# Patient Record
Sex: Female | Born: 1985 | Race: White | Hispanic: No | Smoking: Former smoker
Health system: Southern US, Community
[De-identification: ages and names within clinical notes are randomized; demographics above are authoritative.]

## PROBLEM LIST (undated history)

## (undated) ENCOUNTER — Inpatient Hospital Stay (HOSPITAL_COMMUNITY): Payer: Self-pay

## (undated) DIAGNOSIS — F419 Anxiety disorder, unspecified: Secondary | ICD-10-CM

## (undated) DIAGNOSIS — Z8719 Personal history of other diseases of the digestive system: Secondary | ICD-10-CM

## (undated) DIAGNOSIS — F99 Mental disorder, not otherwise specified: Secondary | ICD-10-CM

## (undated) DIAGNOSIS — M1990 Unspecified osteoarthritis, unspecified site: Secondary | ICD-10-CM

## (undated) DIAGNOSIS — G709 Myoneural disorder, unspecified: Secondary | ICD-10-CM

## (undated) DIAGNOSIS — K219 Gastro-esophageal reflux disease without esophagitis: Secondary | ICD-10-CM

## (undated) DIAGNOSIS — F32A Depression, unspecified: Secondary | ICD-10-CM

## (undated) DIAGNOSIS — F319 Bipolar disorder, unspecified: Secondary | ICD-10-CM

## (undated) DIAGNOSIS — F329 Major depressive disorder, single episode, unspecified: Secondary | ICD-10-CM

## (undated) HISTORY — PX: DILATION AND CURETTAGE OF UTERUS: SHX78

## (undated) HISTORY — PX: HAND SURGERY: SHX662

---

## 2015-02-05 NOTE — L&D Delivery Note (Signed)
Patient is a 30 y.o. now G2P0010 who admitted for IOL for gestation HTN, now s/p NSVD at 3081w5d  Delivery Note At 8:30 PM a viable female was delivered via Vaginal, Spontaneous Delivery. Head delivered LOA. No nuchal cord present. Shoulder and body delivered in usual fashion. Infant to mother's abdomen, stimulated and bulb suctioned. Cord clamped x 2 after and cut by FOB, and infant to warmer. Cord blood drawn. Placenta delivered spontaneously with gentle cord traction. Fundus firm with massage and Pitocin. Perineum inspected and found to have bilateral labial abrasions, and a second degree perineal laceration, which was repaired with 3.0 vycril with good hemostasis achieved.  APGAR: 6, ; weight pending   Placenta status: intact, sent to pathology Cord:  3-vessel  Anesthesia:  Epidural Episiotomy:  none Lacerations:  Second-degree perineal Suture Repair: 3.0 vicryl Est. Blood Loss (mL):  100  Mom to postpartum.  Baby to Couplet care / Skin to Skin.  Kandra NicolasJulie P Degele 10/23/2015, 9:06 PM  OB FELLOW DELIVERY ATTESTATION  I was gloved and present for the delivery in its entirety, and I agree with the above resident's note.    Ernestina PennaNicholas Donevin Sainsbury, MD 9:22 PM

## 2015-04-12 LAB — OB RESULTS CONSOLE ABO/RH: RH TYPE: POSITIVE

## 2015-04-12 LAB — OB RESULTS CONSOLE HEPATITIS B SURFACE ANTIGEN: HEP B S AG: NEGATIVE

## 2015-04-12 LAB — OB RESULTS CONSOLE TSH: TSH: 1.34

## 2015-04-12 LAB — OB RESULTS CONSOLE RUBELLA ANTIBODY, IGM: Rubella: IMMUNE

## 2015-04-12 LAB — OB RESULTS CONSOLE HGB/HCT, BLOOD
HCT: 40 %
HEMOGLOBIN: 14 g/dL

## 2015-04-12 LAB — OB RESULTS CONSOLE VARICELLA ZOSTER ANTIBODY, IGG: Varicella: IMMUNE

## 2015-04-12 LAB — OB RESULTS CONSOLE ANTIBODY SCREEN: Antibody Screen: NEGATIVE

## 2015-04-12 LAB — OB RESULTS CONSOLE HIV ANTIBODY (ROUTINE TESTING): HIV: NONREACTIVE

## 2015-04-12 LAB — OB RESULTS CONSOLE PLATELET COUNT: Platelets: 200 10*3/uL

## 2015-04-12 LAB — OB RESULTS CONSOLE GC/CHLAMYDIA
Chlamydia: NEGATIVE
Gonorrhea: NEGATIVE

## 2015-04-12 LAB — OB RESULTS CONSOLE RPR: RPR: NONREACTIVE

## 2015-04-25 ENCOUNTER — Ambulatory Visit (INDEPENDENT_AMBULATORY_CARE_PROVIDER_SITE_OTHER): Payer: Medicaid Other | Admitting: Advanced Practice Midwife

## 2015-04-25 ENCOUNTER — Other Ambulatory Visit (HOSPITAL_COMMUNITY)
Admission: RE | Admit: 2015-04-25 | Discharge: 2015-04-25 | Disposition: A | Discharge status: Home / Self Care | Payer: Medicaid Other | Source: Ambulatory Visit | Attending: Advanced Practice Midwife | Admitting: Advanced Practice Midwife

## 2015-04-25 ENCOUNTER — Encounter: Payer: Self-pay | Admitting: Advanced Practice Midwife

## 2015-04-25 ENCOUNTER — Encounter: Payer: Self-pay | Admitting: Women's Health

## 2015-04-25 VITALS — BP 110/80 | HR 80 | Ht 67.0 in | Wt 158.0 lb

## 2015-04-25 DIAGNOSIS — Z0283 Encounter for blood-alcohol and blood-drug test: Secondary | ICD-10-CM

## 2015-04-25 DIAGNOSIS — Z124 Encounter for screening for malignant neoplasm of cervix: Secondary | ICD-10-CM

## 2015-04-25 DIAGNOSIS — Z1151 Encounter for screening for human papillomavirus (HPV): Secondary | ICD-10-CM | POA: Diagnosis present

## 2015-04-25 DIAGNOSIS — Z3492 Encounter for supervision of normal pregnancy, unspecified, second trimester: Secondary | ICD-10-CM | POA: Diagnosis not present

## 2015-04-25 DIAGNOSIS — Z01419 Encounter for gynecological examination (general) (routine) without abnormal findings: Secondary | ICD-10-CM | POA: Diagnosis present

## 2015-04-25 DIAGNOSIS — Z3490 Encounter for supervision of normal pregnancy, unspecified, unspecified trimester: Secondary | ICD-10-CM | POA: Insufficient documentation

## 2015-04-25 DIAGNOSIS — Z3682 Encounter for antenatal screening for nuchal translucency: Secondary | ICD-10-CM

## 2015-04-25 MED ORDER — PROMETHAZINE HCL 12.5 MG PO TABS: 12.5000 mg | ORAL_TABLET | Freq: Four times a day (QID) | ORAL | Status: DC | PRN

## 2015-04-25 NOTE — Patient Instructions (Signed)
 First Trimester of Pregnancy The first trimester of pregnancy is from week 1 until the end of week 12 (months 1 through 3). A week after a sperm fertilizes an egg, the egg will implant on the wall of the uterus. This embryo will begin to develop into a baby. Genes from you and your partner are forming the baby. The female genes determine whether the baby is a boy or a girl. At 6-8 weeks, the eyes and face are formed, and the heartbeat can be seen on ultrasound. At the end of 12 weeks, all the baby's organs are formed.  Now that you are pregnant, you will want to do everything you can to have a healthy baby. Two of the most important things are to get good prenatal care and to follow your health care provider's instructions. Prenatal care is all the medical care you receive before the baby's birth. This care will help prevent, find, and treat any problems during the pregnancy and childbirth. BODY CHANGES Your body goes through many changes during pregnancy. The changes vary from woman to woman.   You may gain or lose a couple of pounds at first.  You may feel sick to your stomach (nauseous) and throw up (vomit). If the vomiting is uncontrollable, call your health care provider.  You may tire easily.  You may develop headaches that can be relieved by medicines approved by your health care provider.  You may urinate more often. Painful urination may mean you have a bladder infection.  You may develop heartburn as a result of your pregnancy.  You may develop constipation because certain hormones are causing the muscles that push waste through your intestines to slow down.  You may develop hemorrhoids or swollen, bulging veins (varicose veins).  Your breasts may begin to grow larger and become tender. Your nipples may stick out more, and the tissue that surrounds them (areola) may become darker.  Your gums may bleed and may be sensitive to brushing and flossing.  Dark spots or blotches  (chloasma, mask of pregnancy) may develop on your face. This will likely fade after the baby is born.  Your menstrual periods will stop.  You may have a loss of appetite.  You may develop cravings for certain kinds of food.  You may have changes in your emotions from day to day, such as being excited to be pregnant or being concerned that something may go wrong with the pregnancy and baby.  You may have more vivid and strange dreams.  You may have changes in your hair. These can include thickening of your hair, rapid growth, and changes in texture. Some women also have hair loss during or after pregnancy, or hair that feels dry or thin. Your hair will most likely return to normal after your baby is born. WHAT TO EXPECT AT YOUR PRENATAL VISITS During a routine prenatal visit:  You will be weighed to make sure you and the baby are growing normally.  Your blood pressure will be taken.  Your abdomen will be measured to track your baby's growth.  The fetal heartbeat will be listened to starting around week 10 or 12 of your pregnancy.  Test results from any previous visits will be discussed. Your health care provider may ask you:  How you are feeling.  If you are feeling the baby move.  If you have had any abnormal symptoms, such as leaking fluid, bleeding, severe headaches, or abdominal cramping.  If you have any questions. Other   tests that may be performed during your first trimester include:  Blood tests to find your blood type and to check for the presence of any previous infections. They will also be used to check for low iron levels (anemia) and Rh antibodies. Later in the pregnancy, blood tests for diabetes will be done along with other tests if problems develop.  Urine tests to check for infections, diabetes, or protein in the urine.  An ultrasound to confirm the proper growth and development of the baby.  An amniocentesis to check for possible genetic problems.  Fetal  screens for spina bifida and Down syndrome.  You may need other tests to make sure you and the baby are doing well. HOME CARE INSTRUCTIONS  Medicines  Follow your health care provider's instructions regarding medicine use. Specific medicines may be either safe or unsafe to take during pregnancy.  Take your prenatal vitamins as directed.  If you develop constipation, try taking a stool softener if your health care provider approves. Diet  Eat regular, well-balanced meals. Choose a variety of foods, such as meat or vegetable-based protein, fish, milk and low-fat dairy products, vegetables, fruits, and whole grain breads and cereals. Your health care provider will help you determine the amount of weight gain that is right for you.  Avoid raw meat and uncooked cheese. These carry germs that can cause birth defects in the baby.  Eating four or five small meals rather than three large meals a day may help relieve nausea and vomiting. If you start to feel nauseous, eating a few soda crackers can be helpful. Drinking liquids between meals instead of during meals also seems to help nausea and vomiting.  If you develop constipation, eat more high-fiber foods, such as fresh vegetables or fruit and whole grains. Drink enough fluids to keep your urine clear or pale yellow. Activity and Exercise  Exercise only as directed by your health care provider. Exercising will help you:  Control your weight.  Stay in shape.  Be prepared for labor and delivery.  Experiencing pain or cramping in the lower abdomen or low back is a good sign that you should stop exercising. Check with your health care provider before continuing normal exercises.  Try to avoid standing for long periods of time. Move your legs often if you must stand in one place for a long time.  Avoid heavy lifting.  Wear low-heeled shoes, and practice good posture.  You may continue to have sex unless your health care provider directs you  otherwise. Relief of Pain or Discomfort  Wear a good support bra for breast tenderness.   Take warm sitz baths to soothe any pain or discomfort caused by hemorrhoids. Use hemorrhoid cream if your health care provider approves.   Rest with your legs elevated if you have leg cramps or low back pain.  If you develop varicose veins in your legs, wear support hose. Elevate your feet for 15 minutes, 3-4 times a day. Limit salt in your diet. Prenatal Care  Schedule your prenatal visits by the twelfth week of pregnancy. They are usually scheduled monthly at first, then more often in the last 2 months before delivery.  Write down your questions. Take them to your prenatal visits.  Keep all your prenatal visits as directed by your health care provider. Safety  Wear your seat belt at all times when driving.  Make a list of emergency phone numbers, including numbers for family, friends, the hospital, and police and fire departments. General   Tips  Ask your health care provider for a referral to a local prenatal education class. Begin classes no later than at the beginning of month 6 of your pregnancy.  Ask for help if you have counseling or nutritional needs during pregnancy. Your health care provider can offer advice or refer you to specialists for help with various needs.  Do not use hot tubs, steam rooms, or saunas.  Do not douche or use tampons or scented sanitary pads.  Do not cross your legs for long periods of time.  Avoid cat litter boxes and soil used by cats. These carry germs that can cause birth defects in the baby and possibly loss of the fetus by miscarriage or stillbirth.  Avoid all smoking, herbs, alcohol, and medicines not prescribed by your health care provider. Chemicals in these affect the formation and growth of the baby.  Schedule a dentist appointment. At home, brush your teeth with a soft toothbrush and be gentle when you floss. SEEK MEDICAL CARE IF:   You have  dizziness.  You have mild pelvic cramps, pelvic pressure, or nagging pain in the abdominal area.  You have persistent nausea, vomiting, or diarrhea.  You have a bad smelling vaginal discharge.  You have pain with urination.  You notice increased swelling in your face, hands, legs, or ankles. SEEK IMMEDIATE MEDICAL CARE IF:   You have a fever.  You are leaking fluid from your vagina.  You have spotting or bleeding from your vagina.  You have severe abdominal cramping or pain.  You have rapid weight gain or loss.  You vomit blood or material that looks like coffee grounds.  You are exposed to German measles and have never had them.  You are exposed to fifth disease or chickenpox.  You develop a severe headache.  You have shortness of breath.  You have any kind of trauma, such as from a fall or a car accident. Document Released: 01/15/2001 Document Revised: 06/07/2013 Document Reviewed: 12/01/2012 [employer] Patient Information 2015 [employer]. This information is not intended to replace advice given to you by your health care provider. Make sure you discuss any questions you have with your health care provider.   Nausea & Vomiting  Have saltine crackers or pretzels by your bed and eat a few bites before you raise your head out of bed in the morning  Eat small frequent meals throughout the day instead of large meals  Drink plenty of fluids throughout the day to stay hydrated, just don't drink a lot of fluids with your meals.  This can make your stomach fill up faster making you feel sick  Do not brush your teeth right after you eat  Products with real ginger are good for nausea, like ginger ale and ginger hard candy Make sure it says made with real ginger!  Sucking on sour candy like lemon heads is also good for nausea  If your prenatal vitamins make you nauseated, take them at night so you will sleep through the nausea  Sea Bands  If you feel like you need  medicine for the nausea & vomiting please let us know  If you are unable to keep any fluids or food down please let us know   Constipation  Drink plenty of fluid, preferably water, throughout the day  Eat foods high in fiber such as fruits, vegetables, and grains  Exercise, such as walking, is a good way to keep your bowels regular  Drink warm fluids, especially warm   prune juice, or decaf coffee  Eat a 1/2 cup of real oatmeal (not instant), 1/2 cup applesauce, and 1/2-1 cup warm prune juice every day  If needed, you may take Colace (docusate sodium) stool softener once or twice a day to help keep the stool soft. If you are pregnant, wait until you are out of your first trimester (12-14 weeks of pregnancy)  If you still are having problems with constipation, you may take Miralax once daily as needed to help keep your bowels regular.  If you are pregnant, wait until you are out of your first trimester (12-14 weeks of pregnancy)  Safe Medications in Pregnancy   Acne: Benzoyl Peroxide Salicylic Acid  Backache/Headache: Tylenol: 2 regular strength every 4 hours OR              2 Extra strength every 6 hours  Colds/Coughs/Allergies: Benadryl (alcohol free) 25 mg every 6 hours as needed Breath right strips Claritin Cepacol throat lozenges Chloraseptic throat spray Cold-Eeze- up to three times per day Cough drops, alcohol free Flonase (by prescription only) Guaifenesin Mucinex Robitussin DM (plain only, alcohol free) Saline nasal spray/drops Sudafed (pseudoephedrine) & Actifed ** use only after [redacted] weeks gestation and if you do not have high blood pressure Tylenol Vicks Vaporub Zinc lozenges Zyrtec   Constipation: Colace Ducolax suppositories Fleet enema Glycerin suppositories Metamucil Milk of magnesia Miralax Senokot Smooth move tea  Diarrhea: Kaopectate Imodium A-D  *NO pepto Bismol  Hemorrhoids: Anusol Anusol HC Preparation  H Tucks  Indigestion: Tums Maalox Mylanta Zantac  Pepcid  Insomnia: Benadryl (alcohol free) 25mg every 6 hours as needed Tylenol PM Unisom, no Gelcaps  Leg Cramps: Tums MagGel  Nausea/Vomiting:  Bonine Dramamine Emetrol Ginger extract Sea bands Meclizine  Nausea medication to take during pregnancy:  Unisom (doxylamine succinate 25 mg tablets) Take one tablet daily at bedtime. If symptoms are not adequately controlled, the dose can be increased to a maximum recommended dose of two tablets daily (1/2 tablet in the morning, 1/2 tablet mid-afternoon and one at bedtime). Vitamin B6 100mg tablets. Take one tablet twice a day (up to 200 mg per day).  Skin Rashes: Aveeno products Benadryl cream or 25mg every 6 hours as needed Calamine Lotion 1% cortisone cream  Yeast infection: Gyne-lotrimin 7 Monistat 7   **If taking multiple medications, please check labels to avoid duplicating the same active ingredients **take medication as directed on the label ** Do not exceed 4000 mg of tylenol in 24 hours **Do not take medications that contain aspirin or ibuprofen      

## 2015-04-25 NOTE — Addendum Note (Signed)
Addended by: Criss AlvinePULLIAM, CHRYSTAL G on: 04/25/2015 03:15 PM   Modules accepted: Orders

## 2015-04-25 NOTE — Progress Notes (Signed)
  Subjective:    Shelley [name] is a G3P0010 324w6d being seen today for her first obstetrical visit.  She had a visit with Jonita AlbeeEden MD, but transferred here.  Her obstetrical history is significant for early SAB.Marland Kitchen.  Pregnancy history fully reviewed.  Has long hx of anxiety--used to take Buspar--wants to start therapy  Patient reports dark brown discharge--has spotted on and off, no SCH on US and neg cultures.  Has HA's, dizziness, some nausea. Smoking 5 cigs/day. (down from 1/2-1 ppd).   Filed Vitals:   04/25/15 1357 04/25/15 1400  BP: 110/80   Pulse: 80   Height:  5\' 7"  (1.702 m)  Weight: 158 lb (71.668 kg)     HISTORY: OB History  Gravida Para Term Preterm AB SAB TAB Ectopic Multiple Living  3    1 1         # Outcome Date GA Lbr Len/2nd Weight Sex Delivery Anes PTL Lv  3 Current           2 SAB 02/05/11          1 Gravida              History reviewed. No pertinent past medical history. Past Surgical History  Procedure Laterality Date  . Dilation and curettage of uterus    . Hand surgery      Left pinky finger   History reviewed. No pertinent family history.   Exam       Pelvic Exam:    Perineum: Normal Perineum   Vulva: normal   Vagina:  normal mucosa, normal discharge, no palpable nodules   Uterus Normal, Gravid, FH: 12     Cervix: Friable, normal appearing dc without odor   Adnexa: Not palpable   Urinary:  urethral meatus normal    System:     Skin: normal coloration and turgor, no rashes    Neurologic: oriented, normal, normal mood   Extremities: normal strength, tone, and muscle mass   HEENT PERRLA   Mouth/Teeth mucous membranes moist, normal dentition   Neck supple and no masses   Cardiovascular: regular rate and rhythm   Respiratory:  appears well, vitals normal, no respiratory distress, acyanotic   Abdomen: soft, non-tender;  FHR: 160 US          Assessment:    Pregnancy: G3P0010 Patient Active Problem List   Diagnosis Date Noted  .  Supervision of normal pregnancy 04/25/2015        Plan:     Initial labs drawn. Continue prenatal vitamins  Problem list reviewed and updated  Reviewed n/v relief measures and warning s/s to report --rx phenergan per request Reviewed recommended weight gain based on pre-gravid BMI  Encouraged well-balanced diet Genetic Screening discussed Integrated Screen: requested.  Ultrasound discussed; fetal survey: requested.  [facility] referral made   Return in about 1 week (around 05/02/2015) for US:NT+1st IT only/4 weeks for LROB.  CRESENZO-DISHMAN,Tudor Chandley 04/25/2015

## 2015-04-26 ENCOUNTER — Telehealth: Payer: Self-pay

## 2015-04-26 LAB — PMP SCREEN PROFILE (10S), URINE
Amphetamine Screen, Ur: NEGATIVE ng/mL
Barbiturate Screen, Ur: NEGATIVE ng/mL
Benzodiazepine Screen, Urine: NEGATIVE ng/mL
CREATININE(CRT), U: 157.1 mg/dL (ref 20.0–300.0)
Cannabinoids Ur Ql Scn: POSITIVE ng/mL
Cocaine(Metab.)Screen, Urine: NEGATIVE ng/mL
METHADONE SCREEN, URINE: NEGATIVE ng/mL
OPIATE SCRN UR: NEGATIVE ng/mL
OXYCODONE+OXYMORPHONE UR QL SCN: NEGATIVE ng/mL
PCP Scrn, Ur: NEGATIVE ng/mL
PH UR, DRUG SCRN: 6.6 (ref 4.5–8.9)
PROPOXYPHENE SCREEN: NEGATIVE ng/mL

## 2015-04-26 NOTE — Telephone Encounter (Signed)
Shelley [name] call about Shelley Cancerlizabeth Ton  1985/06/16. She apply for pregnant medicaid and they don't cover mental health.

## 2015-04-27 LAB — CYTOLOGY - PAP

## 2015-05-02 ENCOUNTER — Ambulatory Visit (INDEPENDENT_AMBULATORY_CARE_PROVIDER_SITE_OTHER): Payer: Medicaid Other

## 2015-05-02 ENCOUNTER — Other Ambulatory Visit: Payer: Self-pay

## 2015-05-02 DIAGNOSIS — Z3A13 13 weeks gestation of pregnancy: Secondary | ICD-10-CM | POA: Diagnosis not present

## 2015-05-02 DIAGNOSIS — Z36 Encounter for antenatal screening of mother: Secondary | ICD-10-CM | POA: Diagnosis not present

## 2015-05-02 DIAGNOSIS — Z3491 Encounter for supervision of normal pregnancy, unspecified, first trimester: Secondary | ICD-10-CM

## 2015-05-02 DIAGNOSIS — Z3682 Encounter for antenatal screening for nuchal translucency: Secondary | ICD-10-CM

## 2015-05-02 NOTE — Progress Notes (Signed)
US 12+6 wks,measurement c/w dates,normal ov's bilat,post pl gr 0,NB present,NT 1.368mm,crl 66.512mm,fhr 164 bpm

## 2015-05-04 LAB — MATERNAL SCREEN, INTEGRATED #1
Crown Rump Length: 66.2 mm
Gest. Age on Collection Date: 12.9 weeks
MATERNAL AGE AT EDD: 31 a
NUCHAL TRANSLUCENCY (NT): 1.8 mm
NUMBER OF FETUSES: 1
PAPP-A VALUE: 593.4 ng/mL
Weight: 156 [lb_av]

## 2015-05-05 ENCOUNTER — Telehealth: Payer: Self-pay | Admitting: Obstetrics & Gynecology

## 2015-05-05 DIAGNOSIS — Z3491 Encounter for supervision of normal pregnancy, unspecified, first trimester: Secondary | ICD-10-CM

## 2015-05-05 NOTE — Telephone Encounter (Signed)
Pt states that she has mentioned to the provider at her last visit and was told that the nausea and dizziness would ease off. Pt states that she has noticed some decrease but she is still having issues with the dizziness and now has had some spells of blacking out. Pt states that this is affecting her job. Pt states that she eats small meals throughout the date, and moving slowly when standing from sitting position and when turning her head. Pt is very concerned and wants to know what she can do.

## 2015-05-05 NOTE — Telephone Encounter (Signed)
Unable to reach the pt. LMOM for her to return the call.

## 2015-05-05 NOTE — Telephone Encounter (Signed)
Tell her stay hydrated move slowly and make appointment to see me on Monday

## 2015-05-09 ENCOUNTER — Encounter: Payer: Self-pay | Admitting: Obstetrics & Gynecology

## 2015-05-09 ENCOUNTER — Ambulatory Visit (INDEPENDENT_AMBULATORY_CARE_PROVIDER_SITE_OTHER): Payer: Self-pay | Admitting: Obstetrics & Gynecology

## 2015-05-09 VITALS — BP 100/70 | HR 96 | Wt 160.0 lb

## 2015-05-09 DIAGNOSIS — Z331 Pregnant state, incidental: Secondary | ICD-10-CM

## 2015-05-09 DIAGNOSIS — Z3492 Encounter for supervision of normal pregnancy, unspecified, second trimester: Secondary | ICD-10-CM

## 2015-05-09 DIAGNOSIS — Z1389 Encounter for screening for other disorder: Secondary | ICD-10-CM

## 2015-05-09 LAB — POCT URINALYSIS DIPSTICK
Blood, UA: NEGATIVE
GLUCOSE UA: NEGATIVE
Ketones, UA: NEGATIVE
LEUKOCYTES UA: NEGATIVE
NITRITE UA: NEGATIVE

## 2015-05-09 NOTE — Telephone Encounter (Signed)
Pt returned call and was given an appointment for this afternoon with Dr. Despina HiddenEure.

## 2015-05-09 NOTE — Progress Notes (Signed)
G2P0010 4490w6d Estimated Date of Delivery: 11/08/15  Blood pressure 100/70, pulse 96, weight 160 lb (72.576 kg).   BP weight and urine results all reviewed and noted.  Please refer to the obstetrical flow sheet for the fundal height and fetal heart rate documentation:  Patient reports good fetal movement, denies any bleeding and no rupture of membranes symptoms or regular contractions. Patient is without complaints. All questions were answered.  Orders Placed This Encounter  Procedures  . POCT urinalysis dipstick    Plan:  Continued routine obstetrical care, has history of vertigo and has dizziness issues, recommend motion sickness bracelets  No Follow-up on file.

## 2015-05-23 ENCOUNTER — Encounter: Payer: Self-pay | Admitting: Women's Health

## 2015-05-31 ENCOUNTER — Encounter: Payer: Self-pay | Admitting: Women's Health

## 2015-05-31 ENCOUNTER — Ambulatory Visit (INDEPENDENT_AMBULATORY_CARE_PROVIDER_SITE_OTHER): Payer: Medicaid Other | Admitting: Women's Health

## 2015-05-31 VITALS — BP 104/72 | HR 76 | Wt 164.0 lb

## 2015-05-31 DIAGNOSIS — Z1389 Encounter for screening for other disorder: Secondary | ICD-10-CM

## 2015-05-31 DIAGNOSIS — Z331 Pregnant state, incidental: Secondary | ICD-10-CM

## 2015-05-31 DIAGNOSIS — Z3492 Encounter for supervision of normal pregnancy, unspecified, second trimester: Secondary | ICD-10-CM

## 2015-05-31 DIAGNOSIS — Z3682 Encounter for antenatal screening for nuchal translucency: Secondary | ICD-10-CM

## 2015-05-31 DIAGNOSIS — Z363 Encounter for antenatal screening for malformations: Secondary | ICD-10-CM

## 2015-05-31 DIAGNOSIS — Z3A17 17 weeks gestation of pregnancy: Secondary | ICD-10-CM

## 2015-05-31 DIAGNOSIS — Z3482 Encounter for supervision of other normal pregnancy, second trimester: Secondary | ICD-10-CM

## 2015-05-31 LAB — POCT URINALYSIS DIPSTICK
Glucose, UA: NEGATIVE
Ketones, UA: NEGATIVE
LEUKOCYTES UA: NEGATIVE
NITRITE UA: NEGATIVE
Protein, UA: NEGATIVE
RBC UA: NEGATIVE

## 2015-05-31 NOTE — Patient Instructions (Signed)

## 2015-05-31 NOTE — Progress Notes (Signed)
Low-risk OB appointment G2P0010 6711w0d Estimated Date of Delivery: 11/08/15 BP 104/72 mmHg  Pulse 76  Wt 164 lb (74.39 kg)  BP, weight, and urine reviewed.  Refer to obstetrical flow sheet for FH & FHR.  No fm yet. Denies cramping, lof, vb, or uti s/s. No complaints. Reviewed warning s/s to report. Plan:  Continue routine obstetrical care  F/U in 2wks for OB appointment and anatomy u/s 2nd IT today

## 2015-06-02 LAB — MATERNAL SCREEN, INTEGRATED #2
ADSF: 0.71
AFP MoM: 1.6
Alpha-Fetoprotein: 55.3 ng/mL
CROWN RUMP LENGTH: 66.2 mm
DIA MoM: 1.85
DIA VALUE: 311.7 pg/mL
Estriol, Unconjugated: 0.71 ng/mL
Gest. Age on Collection Date: 12.9 weeks
Gestational Age: 17 weeks
Maternal Age at EDD: 31 years
NUCHAL TRANSLUCENCY (NT): 1.8 mm
NUCHAL TRANSLUCENCY MOM: 1.12
NUMBER OF FETUSES: 1
PAPP-A MoM: 0.57
PAPP-A VALUE: 593.4 ng/mL
Test Results:: NEGATIVE
WEIGHT: 156 [lb_av]
Weight: 156 [lb_av]
hCG MoM: 1.02
hCG Value: 29.2 IU/mL

## 2015-06-04 ENCOUNTER — Encounter (HOSPITAL_COMMUNITY): Payer: Self-pay | Admitting: Emergency Medicine

## 2015-06-04 ENCOUNTER — Emergency Department (HOSPITAL_COMMUNITY)
Admission: EM | Admit: 2015-06-04 | Discharge: 2015-06-04 | Discharge status: Against Medical Advice | Payer: Medicaid Other | Attending: Emergency Medicine | Admitting: Emergency Medicine

## 2015-06-04 ENCOUNTER — Emergency Department (HOSPITAL_COMMUNITY): Payer: Medicaid Other

## 2015-06-04 DIAGNOSIS — J069 Acute upper respiratory infection, unspecified: Secondary | ICD-10-CM | POA: Diagnosis not present

## 2015-06-04 DIAGNOSIS — F17210 Nicotine dependence, cigarettes, uncomplicated: Secondary | ICD-10-CM | POA: Insufficient documentation

## 2015-06-04 DIAGNOSIS — R112 Nausea with vomiting, unspecified: Secondary | ICD-10-CM | POA: Diagnosis present

## 2015-06-04 LAB — CBG MONITORING, ED: Glucose-Capillary: 74 mg/dL (ref 65–99)

## 2015-06-04 MED ORDER — PROMETHAZINE HCL 25 MG RE SUPP: 25.0000 mg | Freq: Four times a day (QID) | RECTAL | Status: DC | PRN

## 2015-06-04 MED ORDER — SODIUM CHLORIDE 0.9 % IV BOLUS (SEPSIS)
1000.0000 mL | Freq: Once | INTRAVENOUS | Status: AC
  Administered 2015-06-04: 1000 mL via INTRAVENOUS

## 2015-06-04 MED ORDER — SODIUM CHLORIDE 0.9 % IV BOLUS (SEPSIS): 1000.0000 mL | Freq: Once | INTRAVENOUS | Status: DC

## 2015-06-04 NOTE — ED Notes (Signed)
Patient has multiple complaints. Per patient [facility] patient is 17.[redacted] weeks pregnant. Patient c/o constant vomiting, shortness or breath, coughing, headache, chest pain, and dizziness.

## 2015-06-04 NOTE — ED Provider Notes (Signed)
CSN: [id number]     Arrival date & time 06/04/15  1644 History   First MD Initiated Contact with Patient 06/04/15 1713     Chief Complaint  Patient presents with  . Emesis     (Consider location/radiation/quality/duration/timing/severity/associated sxs/prior Treatment) Patient is a 30 y.o. female presenting with vomiting. The history is provided by the patient (Patient complains of cough congestion for a few days and vomiting she is).  Emesis Severity:  Moderate Timing:  Intermittent Quality:  Undigested food Able to tolerate:  Liquids Onset of vomiting after eating: Unknown. Progression:  Unchanged Chronicity:  New Recent urination:  Normal Context: not post-tussive   Associated symptoms: no abdominal pain, no diarrhea and no headaches     History reviewed. No pertinent past medical history. Past Surgical History  Procedure Laterality Date  . Dilation and curettage of uterus    . Hand surgery      Left pinky finger   No family history on file. Social History  Substance Use Topics  . Smoking status: Current Every Day Smoker -- 0.50 packs/day    Types: Cigarettes  . Smokeless tobacco: Never Used  . Alcohol Use: No   OB History    Gravida Para Term Preterm AB TAB SAB Ectopic Multiple Living   2    1  1         Review of Systems  Constitutional: Negative for appetite change and fatigue.  HENT: Negative for congestion, ear discharge and sinus pressure.   Eyes: Negative for discharge.  Respiratory: Positive for cough.   Cardiovascular: Negative for chest pain.  Gastrointestinal: Positive for vomiting. Negative for abdominal pain and diarrhea.  Genitourinary: Negative for frequency and hematuria.  Musculoskeletal: Negative for back pain.  Skin: Negative for rash.  Neurological: Negative for seizures and headaches.  Psychiatric/Behavioral: Negative for hallucinations.      Allergies  Shellfish allergy and Other  Home Medications   Prior to Admission  medications   Not on File   BP 99/58 mmHg  Pulse 98  Temp(Src) 98.3 F (36.8 C) (Oral)  Resp 17  Ht 5\' 7"  (1.702 m)  Wt 165 lb (74.844 kg)  BMI 25.84 kg/m2  SpO2 100% Physical Exam  Constitutional: She is oriented to person, place, and time. She appears well-developed.  HENT:  Head: Normocephalic.  Mucous membranes mildly dry  Eyes: Conjunctivae and EOM are normal. No scleral icterus.  Neck: Neck supple. No thyromegaly present.  Cardiovascular: Normal rate and regular rhythm.  Exam reveals no gallop and no friction rub.   No murmur heard. Pulmonary/Chest: No stridor. She has no wheezes. She has no rales. She exhibits no tenderness.  Abdominal: She exhibits no distension. There is no tenderness. There is no rebound.  Musculoskeletal: Normal range of motion. She exhibits no edema.  Lymphadenopathy:    She has no cervical adenopathy.  Neurological: She is oriented to person, place, and time. She exhibits normal muscle tone. Coordination normal.  Skin: No rash noted. No erythema.  Psychiatric: She has a normal mood and affect. Her behavior is normal.    ED Course  Procedures (including critical care time) Labs Review Labs Reviewed  URINALYSIS, ROUTINE W REFLEX MICROSCOPIC (NOT AT [facility])  CBG MONITORING, ED    Imaging Review No results found. I have personally reviewed and evaluated these images and lab results as part of my medical decision-making.   EKG Interpretation None      MDM   Final diagnoses:  URI (upper respiratory infection)  Patient was given 1 L of fluids. She stated that she was still feeling nauseated. I ordered a second liter of fluids and Phenergan for nausea. The nurse informed me that the patient had an touch with her OB/GYN doctor and the patient decided to leave [facility]    Bethann Berkshire, MD 06/04/15 1932

## 2015-06-04 NOTE — ED Notes (Signed)
Into assess patient. Pt states that after speaking with her OB who has given her instructions on OTC medications that she can take she has decided to go home AMA at this time. IV removed and patient left with visitor at this time. Zammitt informed

## 2015-06-14 ENCOUNTER — Ambulatory Visit (INDEPENDENT_AMBULATORY_CARE_PROVIDER_SITE_OTHER): Payer: Medicaid Other | Admitting: Advanced Practice Midwife

## 2015-06-14 ENCOUNTER — Ambulatory Visit (INDEPENDENT_AMBULATORY_CARE_PROVIDER_SITE_OTHER): Payer: Medicaid Other

## 2015-06-14 VITALS — BP 106/84 | HR 110 | Wt 160.0 lb

## 2015-06-14 DIAGNOSIS — F17200 Nicotine dependence, unspecified, uncomplicated: Secondary | ICD-10-CM | POA: Diagnosis not present

## 2015-06-14 DIAGNOSIS — Z3492 Encounter for supervision of normal pregnancy, unspecified, second trimester: Secondary | ICD-10-CM

## 2015-06-14 DIAGNOSIS — Z36 Encounter for antenatal screening of mother: Secondary | ICD-10-CM

## 2015-06-14 DIAGNOSIS — O99332 Smoking (tobacco) complicating pregnancy, second trimester: Secondary | ICD-10-CM

## 2015-06-14 DIAGNOSIS — Z3A19 19 weeks gestation of pregnancy: Secondary | ICD-10-CM

## 2015-06-14 DIAGNOSIS — Z331 Pregnant state, incidental: Secondary | ICD-10-CM

## 2015-06-14 DIAGNOSIS — Z1389 Encounter for screening for other disorder: Secondary | ICD-10-CM

## 2015-06-14 DIAGNOSIS — Z363 Encounter for antenatal screening for malformations: Secondary | ICD-10-CM

## 2015-06-14 LAB — POCT URINALYSIS DIPSTICK
Blood, UA: NEGATIVE
GLUCOSE UA: NEGATIVE
KETONES UA: NEGATIVE
Leukocytes, UA: NEGATIVE
Nitrite, UA: NEGATIVE
Protein, UA: NEGATIVE

## 2015-06-14 MED ORDER — NICOTINE 14 MG/24HR TD PT24: 14.0000 mg | MEDICATED_PATCH | Freq: Every day | TRANSDERMAL | Status: DC

## 2015-06-14 MED ORDER — PROVIDA DHA 16-16-1.25-110 MG PO CAPS: 1.0000 | ORAL_CAPSULE | Freq: Every day | ORAL | Status: DC

## 2015-06-14 NOTE — Progress Notes (Signed)
G2P0010 4259w0d Estimated Date of Delivery: 11/08/15  Blood pressure 106/84, pulse 110, weight 160 lb (72.576 kg).   BP weight and urine results all reviewed and noted.  Please refer to the obstetrical flow sheet for the fundal height and fetal heart rate documentation:  Patient reports good fetal movement, denies any bleeding and no rupture of membranes symptoms or regular contractions. US 19 wks,breech,post pl gr 0,normal ov's bilat,cx 3.6cm,svp of fluid 3.9cm,fhr 159 bpm,efw 289 g,measurements c/w dates,anatomy complete,no obvious abnormalities seen  Patient is without complaints.  Smokes 6/cigs a day.  Really wants to quit. Discussed strategies, including patches (rx sent), vaping, gum  Requested Quitline referral. , faxed Quitline info.  All questions were answered.  Orders Placed This Encounter  Procedures  . POCT urinalysis dipstick    Plan:  Continued routine obstetrical care,   Return in about 4 weeks (around 07/12/2015) for LROB.

## 2015-06-14 NOTE — Progress Notes (Signed)
US 19 wks,breech,post pl gr 0,normal ov's bilat,cx 3.6cm,svp of fluid 3.9cm,fhr 159 bpm,efw 289 g,measurements c/w dates,anatomy complete,no obvious abnormalities seen

## 2015-06-18 ENCOUNTER — Inpatient Hospital Stay (HOSPITAL_COMMUNITY)
Admission: AD | Admit: 2015-06-18 | Discharge: 2015-06-18 | Disposition: A | Discharge status: Home / Self Care | Payer: Medicaid Other | Source: Ambulatory Visit | Attending: Obstetrics & Gynecology | Admitting: Obstetrics & Gynecology

## 2015-06-18 ENCOUNTER — Encounter (HOSPITAL_COMMUNITY): Payer: Self-pay | Admitting: *Deleted

## 2015-06-18 DIAGNOSIS — Z3A19 19 weeks gestation of pregnancy: Secondary | ICD-10-CM | POA: Insufficient documentation

## 2015-06-18 DIAGNOSIS — N949 Unspecified condition associated with female genital organs and menstrual cycle: Secondary | ICD-10-CM

## 2015-06-18 DIAGNOSIS — R102 Pelvic and perineal pain: Secondary | ICD-10-CM | POA: Insufficient documentation

## 2015-06-18 DIAGNOSIS — O26892 Other specified pregnancy related conditions, second trimester: Secondary | ICD-10-CM | POA: Insufficient documentation

## 2015-06-18 DIAGNOSIS — F17210 Nicotine dependence, cigarettes, uncomplicated: Secondary | ICD-10-CM | POA: Diagnosis not present

## 2015-06-18 DIAGNOSIS — Z3A20 20 weeks gestation of pregnancy: Secondary | ICD-10-CM

## 2015-06-18 DIAGNOSIS — O9989 Other specified diseases and conditions complicating pregnancy, childbirth and the puerperium: Secondary | ICD-10-CM | POA: Diagnosis not present

## 2015-06-18 LAB — URINALYSIS, ROUTINE W REFLEX MICROSCOPIC
Bilirubin Urine: NEGATIVE
GLUCOSE, UA: NEGATIVE mg/dL
Ketones, ur: 15 mg/dL — AB
LEUKOCYTES UA: NEGATIVE
Nitrite: NEGATIVE
PH: 6 (ref 5.0–8.0)
PROTEIN: NEGATIVE mg/dL
Specific Gravity, Urine: 1.025 (ref 1.005–1.030)

## 2015-06-18 LAB — URINE MICROSCOPIC-ADD ON

## 2015-06-18 LAB — WET PREP, GENITAL
CLUE CELLS WET PREP: NONE SEEN
Sperm: NONE SEEN
Trich, Wet Prep: NONE SEEN
Yeast Wet Prep HPF POC: NONE SEEN

## 2015-06-18 MED ORDER — IBUPROFEN 800 MG PO TABS
800.0000 mg | ORAL_TABLET | Freq: Once | ORAL | Status: AC
  Administered 2015-06-18: 800 mg via ORAL
  Filled 2015-06-18: qty 1

## 2015-06-18 NOTE — MAU Provider Note (Signed)
History     CSN: [id number]  Arrival date and time: 06/18/15 2002   First Provider Initiated Contact with Patient 06/18/15 2041      Chief Complaint  Patient presents with  . Pelvic Pain  . Dizziness   Pelvic Pain The patient's primary symptoms include pelvic pain and vaginal bleeding. This is a new problem. The current episode started today. The problem occurs intermittently. The problem has been unchanged. Pain severity now: 10/10  The problem affects both sides. She is pregnant. Associated symptoms include abdominal pain and nausea. Pertinent negatives include no chills, constipation, diarrhea, dysuria, fever, frequency, urgency or vomiting. The vaginal bleeding is spotting (spotting with wiping ). She has not been passing clots. She has not been passing tissue. Nothing aggravates the symptoms. She has tried acetaminophen for the symptoms. The treatment provided no relief. She is not sexually active. It is unknown whether or not her partner has an STD.  Dizziness This is a new problem. The current episode started today. The problem occurs constantly (patient states that she often feels dizzy when she is in a lot of pain ). The problem has been unchanged. Associated symptoms include abdominal pain and nausea. Pertinent negatives include no chills, fever or vomiting. Nothing aggravates the symptoms. She has tried nothing for the symptoms.     History reviewed. No pertinent past medical history.  Past Surgical History  Procedure Laterality Date  . Dilation and curettage of uterus    . Hand surgery      Left pinky finger    History reviewed. No pertinent family history.  Social History  Substance Use Topics  . Smoking status: Current Every Day Smoker -- 0.50 packs/day    Types: Cigarettes  . Smokeless tobacco: Never Used  . Alcohol Use: No    Allergies:  Allergies  Allergen Reactions  . Shellfish Allergy Anaphylaxis and Hives  . Other Nausea And Vomiting    Red meat  causes abdominal pain    Prescriptions prior to admission  Medication Sig Dispense Refill Last Dose  . nicotine (NICODERM CQ - DOSED IN MG/24 HOURS) 14 mg/24hr patch Place 1 patch (14 mg total) onto the skin daily. 28 patch 6   . Prenat-FeFum-FePo-FA-DHA w/o A (PROVIDA DHA) 16-16-1.25-110 MG CAPS Take 1 tablet by mouth daily. 30 capsule 11     Review of Systems  Constitutional: Negative for fever and chills.  Gastrointestinal: Positive for nausea and abdominal pain. Negative for vomiting, diarrhea and constipation.  Genitourinary: Positive for pelvic pain. Negative for dysuria, urgency and frequency.  Neurological: Positive for dizziness.   Physical Exam   Blood pressure 130/67, pulse 71, temperature 97.6 F (36.4 C), temperature source Oral, resp. rate 32, height 5\' 7"  (1.702 m), weight 72.576 kg (160 lb), SpO2 98 %.  Physical Exam  Nursing note and vitals reviewed. Constitutional: She is oriented to person, place, and time. She appears well-developed and well-nourished. No distress.  HENT:  Head: Normocephalic.  Cardiovascular: Normal rate.   Respiratory: Effort normal.  GI: Soft. There is no tenderness. There is no rebound.  Genitourinary:   External: no lesion Vagina: small amount of white discharge. No blood seen  Cervix: pink, smooth, no CMT, closed/thick  Uterus: AGA, FHT 150 with doppler    Neurological: She is alert and oriented to person, place, and time.  Skin: Skin is warm and dry.  Psychiatric: She has a normal mood and affect.    MAU Course  Procedures  MDM 2202: Patient reports  improvement in her pain with ibuprofen.   Assessment and Plan   1. Round ligament pain    DC home Urine culture pending  Comfort measures reviewed  2nd Trimester precautions  PTL precautions  Fetal kick counts RX: none  Return to MAU as needed FU with OB as planned  Follow-up Information    Follow up with [facility]   Specialty:  Obstetrics and Gynecology    Why:  As scheduled   Contact information:   [address] [place] [address] [phone]        Tawnya Crook 06/18/2015, 8:44 PM

## 2015-06-18 NOTE — Discharge Instructions (Signed)

## 2015-06-18 NOTE — MAU Note (Signed)
Patient presents at 919 weeks gestation with c/o abdominal pain since 0100 today followed by dizziness a couple of hours later. Fetus active. Denies discharge but noted a pinkish tint when voiding that has now graduated to bright red when wiping after voiding.

## 2015-06-19 LAB — GC/CHLAMYDIA PROBE AMP (~~LOC~~) NOT AT ARMC
Chlamydia: NEGATIVE
Neisseria Gonorrhea: NEGATIVE

## 2015-06-20 LAB — URINE CULTURE: Culture: >=100000 — AB

## 2015-06-21 ENCOUNTER — Telehealth: Payer: Self-pay | Admitting: Certified Nurse Midwife

## 2015-06-21 MED ORDER — AMOXICILLIN 500 MG PO CAPS: 500.0000 mg | ORAL_CAPSULE | Freq: Three times a day (TID) | ORAL | Status: AC

## 2015-06-21 NOTE — Telephone Encounter (Signed)
Sent RX Amoxicillin 500 mg TID x 10 days. Spoke directly to the pt.

## 2015-06-22 ENCOUNTER — Telehealth (HOSPITAL_COMMUNITY): Payer: Self-pay | Admitting: *Deleted

## 2015-06-23 ENCOUNTER — Telehealth: Payer: Self-pay | Admitting: Obstetrics & Gynecology

## 2015-06-23 NOTE — Telephone Encounter (Signed)
Pt aware that [facility] [place] on the 10th of this month. Pt verbalized understanding and will call the pharmacy again.

## 2015-06-23 NOTE — Telephone Encounter (Signed)
Pt called stating that her doesn't know where a medication was called into, please contact pt

## 2015-07-04 ENCOUNTER — Telehealth: Payer: Self-pay | Admitting: *Deleted

## 2015-07-04 NOTE — Telephone Encounter (Signed)
Pt states completed antibiotic for UTI, now thinks she has a yeast infection. Pt advised per Joellyn HaffKim Booker, CNM to take OTC Monistat if no improvement to call our office back. Pt verbalized understanding.   Pt also requested to r/s her appt from 07/17/2015 to 07/19/2015, pt will be out of town.

## 2015-07-17 ENCOUNTER — Encounter: Payer: Medicaid Other | Admitting: Women's Health

## 2015-07-19 ENCOUNTER — Encounter: Payer: Self-pay | Admitting: Women's Health

## 2015-07-19 ENCOUNTER — Ambulatory Visit (INDEPENDENT_AMBULATORY_CARE_PROVIDER_SITE_OTHER): Payer: Medicaid Other | Admitting: Women's Health

## 2015-07-19 VITALS — BP 112/60 | HR 96 | Wt 160.0 lb

## 2015-07-19 DIAGNOSIS — F1210 Cannabis abuse, uncomplicated: Secondary | ICD-10-CM

## 2015-07-19 DIAGNOSIS — F1290 Cannabis use, unspecified, uncomplicated: Secondary | ICD-10-CM | POA: Insufficient documentation

## 2015-07-19 DIAGNOSIS — Z1389 Encounter for screening for other disorder: Secondary | ICD-10-CM

## 2015-07-19 DIAGNOSIS — Z331 Pregnant state, incidental: Secondary | ICD-10-CM

## 2015-07-19 DIAGNOSIS — Z3492 Encounter for supervision of normal pregnancy, unspecified, second trimester: Secondary | ICD-10-CM

## 2015-07-19 LAB — POCT URINALYSIS DIPSTICK
Blood, UA: NEGATIVE
Glucose, UA: NEGATIVE
KETONES UA: NEGATIVE
LEUKOCYTES UA: NEGATIVE
NITRITE UA: NEGATIVE
PROTEIN UA: NEGATIVE

## 2015-07-19 MED ORDER — ONDANSETRON HCL 4 MG PO TABS: 4.0000 mg | ORAL_TABLET | Freq: Three times a day (TID) | ORAL | Status: DC | PRN

## 2015-07-19 NOTE — Progress Notes (Signed)
Low-risk OB appointment G2P0010 944w0d Estimated Date of Delivery: 11/08/15 BP 112/60 mmHg  Pulse 96  Wt 160 lb (72.576 kg)  BP, weight, and urine reviewed.  Refer to obstetrical flow sheet for FH & FHR.  Reports good fm.  Denies regular uc's, lof, vb, or uti s/s. Discussed no weight gain to date, states she stays nauseated so she doesn't have appetite. Phenergan didn't help. Rx zofran today. To try to eat small snacks/meals throughout the day. Hasn't heard from Quitline and hasn't been able to pick up patches yet, she can also call 1800QUITNOW.  Reviewed ptl s/s, fm. Plan:  Continue routine obstetrical care  F/U in 4wks for OB appointment and pn2

## 2015-07-19 NOTE — Patient Instructions (Addendum)
You will have your sugar test next visit.  Please do not eat or drink anything after midnight the night before you come, not even water.  You will be here for at least two hours.     1-800-QUIT-NOW  Call the office [phone]) or go to [facility] if:  You begin to have strong, frequent contractions  Your water breaks.  Sometimes it is a big gush of fluid, sometimes it is just a trickle that keeps getting your panties wet or running down your legs  You have vaginal bleeding.  It is normal to have a small amount of spotting if your cervix was checked.   You don't feel your baby moving like normal.  If you don't, get you something to eat and drink and lay down and focus on feeling your baby move.   If your baby is still not moving like normal, you should call the office or go to [facility].  Second Trimester of Pregnancy The second trimester is from week 13 through week 28, months 4 through 6. The second trimester is often a time when you feel your best. Your body has also adjusted to being pregnant, and you begin to feel better physically. Usually, morning sickness has lessened or quit completely, you may have more energy, and you may have an increase in appetite. The second trimester is also a time when the fetus is growing rapidly. At the end of the sixth month, the fetus is about 9 inches long and weighs about 1 pounds. You will likely begin to feel the baby move (quickening) between 18 and 20 weeks of the pregnancy. BODY CHANGES Your body goes through many changes during pregnancy. The changes vary from woman to woman.   Your weight will continue to increase. You will notice your lower abdomen bulging out.  You may begin to get stretch marks on your hips, abdomen, and breasts.  You may develop headaches that can be relieved by medicines approved by your health care provider.  You may urinate more often because the fetus is pressing on your bladder.  You may develop or  continue to have heartburn as a result of your pregnancy.  You may develop constipation because certain hormones are causing the muscles that push waste through your intestines to slow down.  You may develop hemorrhoids or swollen, bulging veins (varicose veins).  You may have back pain because of the weight gain and pregnancy hormones relaxing your joints between the bones in your pelvis and as a result of a shift in weight and the muscles that support your balance.  Your breasts will continue to grow and be tender.  Your gums may bleed and may be sensitive to brushing and flossing.  Dark spots or blotches (chloasma, mask of pregnancy) may develop on your face. This will likely fade after the baby is born.  A dark line from your belly button to the pubic area (linea nigra) may appear. This will likely fade after the baby is born.  You may have changes in your hair. These can include thickening of your hair, rapid growth, and changes in texture. Some women also have hair loss during or after pregnancy, or hair that feels dry or thin. Your hair will most likely return to normal after your baby is born. WHAT TO EXPECT AT YOUR PRENATAL VISITS During a routine prenatal visit:  You will be weighed to make sure you and the fetus are growing normally.  Your blood pressure will be  taken.  Your abdomen will be measured to track your baby's growth.  The fetal heartbeat will be listened to.  Any test results from the previous visit will be discussed. Your health care provider may ask you:  How you are feeling.  If you are feeling the baby move.  If you have had any abnormal symptoms, such as leaking fluid, bleeding, severe headaches, or abdominal cramping.  If you have any questions. Other tests that may be performed during your second trimester include:  Blood tests that check for:  Low iron levels (anemia).  Gestational diabetes (between 24 and 28 weeks).  Rh antibodies.  Urine  tests to check for infections, diabetes, or protein in the urine.  An ultrasound to confirm the proper growth and development of the baby.  An amniocentesis to check for possible genetic problems.  Fetal screens for spina bifida and Down syndrome. HOME CARE INSTRUCTIONS   Avoid all smoking, herbs, alcohol, and unprescribed drugs. These chemicals affect the formation and growth of the baby.  Follow your health care provider's instructions regarding medicine use. There are medicines that are either safe or unsafe to take during pregnancy.  Exercise only as directed by your health care provider. Experiencing uterine cramps is a good sign to stop exercising.  Continue to eat regular, healthy meals.  Wear a good support bra for breast tenderness.  Do not use hot tubs, steam rooms, or saunas.  Wear your seat belt at all times when driving.  Avoid raw meat, uncooked cheese, cat litter boxes, and soil used by cats. These carry germs that can cause birth defects in the baby.  Take your prenatal vitamins.  Try taking a stool softener (if your health care provider approves) if you develop constipation. Eat more high-fiber foods, such as fresh vegetables or fruit and whole grains. Drink plenty of fluids to keep your urine clear or pale yellow.  Take warm sitz baths to soothe any pain or discomfort caused by hemorrhoids. Use hemorrhoid cream if your health care provider approves.  If you develop varicose veins, wear support hose. Elevate your feet for 15 minutes, 3-4 times a day. Limit salt in your diet.  Avoid heavy lifting, wear low heel shoes, and practice good posture.  Rest with your legs elevated if you have leg cramps or low back pain.  Visit your dentist if you have not gone yet during your pregnancy. Use a soft toothbrush to brush your teeth and be gentle when you floss.  A sexual relationship may be continued unless your health care provider directs you otherwise.  Continue to  go to all your prenatal visits as directed by your health care provider. SEEK MEDICAL CARE IF:   You have dizziness.  You have mild pelvic cramps, pelvic pressure, or nagging pain in the abdominal area.  You have persistent nausea, vomiting, or diarrhea.  You have a bad smelling vaginal discharge.  You have pain with urination. SEEK IMMEDIATE MEDICAL CARE IF:   You have a fever.  You are leaking fluid from your vagina.  You have spotting or bleeding from your vagina.  You have severe abdominal cramping or pain.  You have rapid weight gain or loss.  You have shortness of breath with chest pain.  You notice sudden or extreme swelling of your face, hands, ankles, feet, or legs.  You have not felt your baby move in over an hour.  You have severe headaches that do not go away with medicine.  You have  vision changes. Document Released: 01/15/2001 Document Revised: 01/26/2013 Document Reviewed: 03/24/2012 [facility] Patient Information 2015 [employer] [place]. This information is not intended to replace advice given to you by your health care provider. Make sure you discuss any questions you have with your health care provider.

## 2015-07-27 ENCOUNTER — Telehealth: Payer: Self-pay | Admitting: Advanced Practice Midwife

## 2015-07-27 NOTE — Telephone Encounter (Signed)
Pt was advised of Fran's instructions and wanted to make an appointment for next week to discuss the issues she is having with eating and the feeling she gets after eating.

## 2015-07-27 NOTE — Telephone Encounter (Signed)
Bruise is not a concern if it is just a bruise.  Mastitis is very uncommon in pregnancy, but if she gets a fever >100.5, let someone know.  If she wants to come in next week to talk about her other problems, she can. Drenda FreezeFran

## 2015-07-27 NOTE — Telephone Encounter (Signed)
Pt states that she woke up with a dark bruise above her nipple on her left breast. Pt denies any redness to the area. Pt states that there is some warmth and soreness to the area. Pt denies any nipple discharge. Pt states that symptoms just started this morning. Pt is concerned. Pt states that she still has concerns about eating and the feeling she gets after eating.   I advised the pt that I would send this message to Drenda FreezeFran and she would hear back from one of us. Pt verbalized understanding.

## 2015-08-01 ENCOUNTER — Encounter: Payer: Medicaid Other | Admitting: Obstetrics & Gynecology

## 2015-08-15 ENCOUNTER — Inpatient Hospital Stay (HOSPITAL_COMMUNITY)
Admission: AD | Admit: 2015-08-15 | Discharge: 2015-08-15 | Discharge status: Against Medical Advice | Payer: Medicaid Other | Source: Ambulatory Visit | Attending: Obstetrics & Gynecology | Admitting: Obstetrics & Gynecology

## 2015-08-15 ENCOUNTER — Telehealth: Payer: Self-pay | Admitting: *Deleted

## 2015-08-15 DIAGNOSIS — Z3492 Encounter for supervision of normal pregnancy, unspecified, second trimester: Secondary | ICD-10-CM

## 2015-08-15 DIAGNOSIS — R1110 Vomiting, unspecified: Secondary | ICD-10-CM | POA: Insufficient documentation

## 2015-08-15 LAB — URINALYSIS, ROUTINE W REFLEX MICROSCOPIC
Bilirubin Urine: NEGATIVE
GLUCOSE, UA: NEGATIVE mg/dL
HGB URINE DIPSTICK: NEGATIVE
Ketones, ur: 40 mg/dL — AB
LEUKOCYTES UA: NEGATIVE
Nitrite: NEGATIVE
PH: 7 (ref 5.0–8.0)
PROTEIN: NEGATIVE mg/dL
SPECIFIC GRAVITY, URINE: 1.01 (ref 1.005–1.030)

## 2015-08-15 NOTE — Telephone Encounter (Signed)
Pt c/o nausea/vomiting since Sunday, green mucus discharge with cramping, +FM. Pt has Rx for Zofran but has not gotten filled. Pt [facility] of dehydration due to Nausea/vomiting since Sunday. Pt also advised to get Zofran prescription filled for the n/v. Pt verbalized understanding.

## 2015-08-15 NOTE — MAU Note (Signed)
Not in lobby x2.

## 2015-08-15 NOTE — MAU Note (Signed)
Been throwing up, hasn't been able to hold anything down really since Sunday.  Had green mucous d/c and cramping on Sunday, has died down now.+

## 2015-08-15 NOTE — MAU Note (Signed)
Urine sent to lab 

## 2015-08-15 NOTE — MAU Note (Signed)
Not in lobby x3. Left AMA without notifying staff 

## 2015-08-15 NOTE — MAU Note (Signed)
Not in lobby x1  

## 2015-08-16 ENCOUNTER — Ambulatory Visit (INDEPENDENT_AMBULATORY_CARE_PROVIDER_SITE_OTHER): Payer: Medicaid Other | Admitting: Advanced Practice Midwife

## 2015-08-16 ENCOUNTER — Other Ambulatory Visit: Payer: Medicaid Other

## 2015-08-16 VITALS — BP 118/80 | HR 70 | Wt 164.0 lb

## 2015-08-16 DIAGNOSIS — Z369 Encounter for antenatal screening, unspecified: Secondary | ICD-10-CM

## 2015-08-16 DIAGNOSIS — Z1389 Encounter for screening for other disorder: Secondary | ICD-10-CM

## 2015-08-16 DIAGNOSIS — Z3A28 28 weeks gestation of pregnancy: Secondary | ICD-10-CM

## 2015-08-16 DIAGNOSIS — Z331 Pregnant state, incidental: Secondary | ICD-10-CM

## 2015-08-16 DIAGNOSIS — Z131 Encounter for screening for diabetes mellitus: Secondary | ICD-10-CM

## 2015-08-16 DIAGNOSIS — Z3483 Encounter for supervision of other normal pregnancy, third trimester: Secondary | ICD-10-CM

## 2015-08-16 DIAGNOSIS — Z3493 Encounter for supervision of normal pregnancy, unspecified, third trimester: Secondary | ICD-10-CM

## 2015-08-16 LAB — POCT URINALYSIS DIPSTICK
GLUCOSE UA: NEGATIVE
KETONES UA: NEGATIVE
Leukocytes, UA: NEGATIVE
NITRITE UA: NEGATIVE

## 2015-08-16 MED ORDER — OMEPRAZOLE 20 MG PO CPDR: 20.0000 mg | DELAYED_RELEASE_CAPSULE | Freq: Every day | ORAL | Status: DC

## 2015-08-16 NOTE — Patient Instructions (Signed)

## 2015-08-16 NOTE — Progress Notes (Signed)
G2P0010 2793w0d Estimated Date of Delivery: 11/08/15  Blood pressure 118/80, pulse 70, weight 164 lb (74.39 kg).   BP weight and urine results all reviewed and noted.  Please refer to the obstetrical flow sheet for the fundal height and fetal heart rate documentation:  Patient reports good fetal movement, denies any bleeding and no rupture of membranes symptoms or regular contractions. Patient C/O greenish mucousy discharge on Sunday  Denies itching or irritation.  None now.  SSE:  Normal appearing DC, cx closed.   C/O vomiting for 4 days, not nauseated, just reflux.  Rx Prilosec Back to smoking 5-10/day.  "my nerves are too bad to quit" All questions were answered.  Orders Placed This Encounter  Procedures  . POCT urinalysis dipstick    Plan:  Continued routine obstetrical care, PN2 today  Return in about 3 weeks (around 09/06/2015) for LROB.

## 2015-08-17 LAB — GLUCOSE TOLERANCE, 2 HOURS W/ 1HR
GLUCOSE, FASTING: 83 mg/dL (ref 65–91)
Glucose, 1 hour: 148 mg/dL (ref 65–179)
Glucose, 2 hour: 79 mg/dL (ref 65–152)

## 2015-08-17 LAB — CBC
HEMATOCRIT: 33.8 % — AB (ref 34.0–46.6)
Hemoglobin: 11.9 g/dL (ref 11.1–15.9)
MCH: 32 pg (ref 26.6–33.0)
MCHC: 35.2 g/dL (ref 31.5–35.7)
MCV: 91 fL (ref 79–97)
PLATELETS: 176 10*3/uL (ref 150–379)
RBC: 3.72 x10E6/uL — ABNORMAL LOW (ref 3.77–5.28)
RDW: 13.4 % (ref 12.3–15.4)
WBC: 10.3 10*3/uL (ref 3.4–10.8)

## 2015-08-17 LAB — ANTIBODY SCREEN: ANTIBODY SCREEN: NEGATIVE

## 2015-08-17 LAB — HIV ANTIBODY (ROUTINE TESTING W REFLEX): HIV SCREEN 4TH GENERATION: NONREACTIVE

## 2015-08-17 LAB — RPR: RPR: NONREACTIVE

## 2015-09-06 ENCOUNTER — Telehealth: Payer: Self-pay | Admitting: *Deleted

## 2015-09-06 ENCOUNTER — Encounter: Payer: Medicaid Other | Admitting: Women's Health

## 2015-09-06 MED ORDER — ESOMEPRAZOLE MAGNESIUM 20 MG PO CPDR: 20.0000 mg | DELAYED_RELEASE_CAPSULE | Freq: Every day | ORAL | 3 refills | Status: DC

## 2015-09-06 NOTE — Telephone Encounter (Signed)
Still has severe reflux despite prilosec 20mg  BID.  Rx Nexium 20mg  qd

## 2015-09-06 NOTE — Telephone Encounter (Signed)
Pt c/o acid reflux, especially at night. Pt states she is taking the Prilosec 20 mg one in am and at night and is not helping.   Per Janan Ridge, CNM will prescribe Nexium as an alternative med. Pt to follow up pharmacy.

## 2015-09-11 ENCOUNTER — Ambulatory Visit (INDEPENDENT_AMBULATORY_CARE_PROVIDER_SITE_OTHER): Payer: Medicaid Other | Admitting: Obstetrics and Gynecology

## 2015-09-11 VITALS — BP 110/80 | HR 78 | Wt 175.0 lb

## 2015-09-11 DIAGNOSIS — Z3493 Encounter for supervision of normal pregnancy, unspecified, third trimester: Secondary | ICD-10-CM

## 2015-09-11 DIAGNOSIS — O368131 Decreased fetal movements, third trimester, fetus 1: Secondary | ICD-10-CM | POA: Diagnosis not present

## 2015-09-11 DIAGNOSIS — Z1389 Encounter for screening for other disorder: Secondary | ICD-10-CM

## 2015-09-11 DIAGNOSIS — Z331 Pregnant state, incidental: Secondary | ICD-10-CM

## 2015-09-11 LAB — POCT URINALYSIS DIPSTICK
GLUCOSE UA: NEGATIVE
KETONES UA: NEGATIVE
LEUKOCYTES UA: NEGATIVE
Nitrite, UA: NEGATIVE
RBC UA: NEGATIVE

## 2015-09-11 NOTE — Progress Notes (Signed)
Patient ID: Shelley [name], female   DOB: [birth year], 30 y.o.   MRN: [id number]  G2P0010 1782w5d Estimated Date of Delivery: 11/08/15 Shelley [name]  Patient reports denies any bleeding and no rupture of membranes symptoms or regular contractions.  Patient complaints: She complains of decreased fetal movement. Pt states she has not eaten today.   She also complains of BLE edema, which is worsened with prolonged periods of standing. Pt states she works in [employer] and is frequently on her feet all day.   Blood pressure 110/80, pulse 78, weight 175 lb (79.4 kg).  refer to the ob flow sheet for FH and FHR, also BP, Wt, Urine results:notable for none                          Physical Examination: General appearance - alert, well appearing, and in no distress                                      Abdomen  -FHR 138 bpm                                                         soft, nontender, nondistended, no masses or organomegaly                                      Pelvic - VULVA: normal appearing vulva with no masses, tenderness or lesions,      VAGINA: normal appearing vagina with normal color and discharge, no lesions,      CERVIX: normal appearing cervix without discharge or lesions                                             Questions were answered. Assessment: Shelley [name] G2P0010 @ 5282w5d  NST today; reactive  Edema, considered normal for gest age. Plan:  Continued routine obstetrical care,   F/u in 2 weeks for routine prenatal care   By signing my name below, I, Doreatha MartinEva Mathews, attest that this documentation has been prepared under the direction and in the presence of Tilda BurrowJohn V Joe Gee, MD. Electronically Signed: Doreatha MartinEva Mathews, ED Scribe. 09/11/15. 3:53 PM.  I personally performed the services described in this documentation, which was SCRIBED in my presence. The recorded information has been reviewed and considered accurate. It has been edited as necessary during review. Tilda BurrowFERGUSON,Aviannah Castoro V, MD

## 2015-09-25 ENCOUNTER — Encounter: Payer: Self-pay | Admitting: Obstetrics & Gynecology

## 2015-09-25 ENCOUNTER — Ambulatory Visit (INDEPENDENT_AMBULATORY_CARE_PROVIDER_SITE_OTHER): Payer: Medicaid Other | Admitting: Obstetrics & Gynecology

## 2015-09-25 VITALS — BP 120/80 | HR 84 | Wt 179.0 lb

## 2015-09-25 DIAGNOSIS — Z1389 Encounter for screening for other disorder: Secondary | ICD-10-CM

## 2015-09-25 DIAGNOSIS — Z3493 Encounter for supervision of normal pregnancy, unspecified, third trimester: Secondary | ICD-10-CM

## 2015-09-25 DIAGNOSIS — Z3483 Encounter for supervision of other normal pregnancy, third trimester: Secondary | ICD-10-CM

## 2015-09-25 DIAGNOSIS — Z331 Pregnant state, incidental: Secondary | ICD-10-CM

## 2015-09-25 DIAGNOSIS — Z3A34 34 weeks gestation of pregnancy: Secondary | ICD-10-CM

## 2015-09-25 LAB — POCT URINALYSIS DIPSTICK
GLUCOSE UA: NEGATIVE
Leukocytes, UA: NEGATIVE
NITRITE UA: NEGATIVE
PROTEIN UA: 1
RBC UA: NEGATIVE

## 2015-09-25 MED ORDER — METOCLOPRAMIDE HCL 10 MG PO TABS: 10.0000 mg | ORAL_TABLET | Freq: Three times a day (TID) | ORAL | 1 refills | Status: DC

## 2015-09-25 NOTE — Progress Notes (Signed)
G2P0010 4665w5d Estimated Date of Delivery: 11/08/15  Blood pressure 120/80, pulse 84, weight 179 lb (81.2 kg).   BP weight and urine results all reviewed and noted.  Please refer to the obstetrical flow sheet for the fundal height and fetal heart rate documentation:  Patient reports good fetal movement, denies any bleeding and no rupture of membranes symptoms or regular contractions. Patient is without complaints. All questions were answered.  Orders Placed This Encounter  Procedures  . POCT urinalysis dipstick    Plan:  Continued routine obstetrical care, will add reglan to her prilosec to see if gets her GERD/pp nausea, vomiting better  Return in about 2 weeks (around 10/09/2015) for LROB.

## 2015-10-10 ENCOUNTER — Encounter: Payer: Self-pay | Admitting: Women's Health

## 2015-10-10 ENCOUNTER — Ambulatory Visit (INDEPENDENT_AMBULATORY_CARE_PROVIDER_SITE_OTHER): Payer: Medicaid Other | Admitting: Women's Health

## 2015-10-10 VITALS — BP 140/94 | HR 84 | Wt 185.0 lb

## 2015-10-10 DIAGNOSIS — Z3A36 36 weeks gestation of pregnancy: Secondary | ICD-10-CM

## 2015-10-10 DIAGNOSIS — O99323 Drug use complicating pregnancy, third trimester: Secondary | ICD-10-CM

## 2015-10-10 DIAGNOSIS — O99343 Other mental disorders complicating pregnancy, third trimester: Secondary | ICD-10-CM

## 2015-10-10 DIAGNOSIS — O1203 Gestational edema, third trimester: Secondary | ICD-10-CM

## 2015-10-10 DIAGNOSIS — F17200 Nicotine dependence, unspecified, uncomplicated: Secondary | ICD-10-CM | POA: Insufficient documentation

## 2015-10-10 DIAGNOSIS — Z1389 Encounter for screening for other disorder: Secondary | ICD-10-CM

## 2015-10-10 DIAGNOSIS — Z3493 Encounter for supervision of normal pregnancy, unspecified, third trimester: Secondary | ICD-10-CM

## 2015-10-10 DIAGNOSIS — Z3483 Encounter for supervision of other normal pregnancy, third trimester: Secondary | ICD-10-CM

## 2015-10-10 DIAGNOSIS — F1290 Cannabis use, unspecified, uncomplicated: Secondary | ICD-10-CM

## 2015-10-10 DIAGNOSIS — F419 Anxiety disorder, unspecified: Secondary | ICD-10-CM | POA: Insufficient documentation

## 2015-10-10 DIAGNOSIS — O99333 Smoking (tobacco) complicating pregnancy, third trimester: Secondary | ICD-10-CM

## 2015-10-10 DIAGNOSIS — IMO0001 Reserved for inherently not codable concepts without codable children: Secondary | ICD-10-CM

## 2015-10-10 DIAGNOSIS — Z331 Pregnant state, incidental: Secondary | ICD-10-CM

## 2015-10-10 DIAGNOSIS — R030 Elevated blood-pressure reading, without diagnosis of hypertension: Secondary | ICD-10-CM

## 2015-10-10 LAB — POCT URINALYSIS DIPSTICK
Blood, UA: NEGATIVE
GLUCOSE UA: NEGATIVE
KETONES UA: NEGATIVE
Leukocytes, UA: NEGATIVE
Nitrite, UA: NEGATIVE

## 2015-10-10 MED ORDER — OMEPRAZOLE 20 MG PO CPDR: 20.0000 mg | DELAYED_RELEASE_CAPSULE | Freq: Every day | ORAL | 1 refills | Status: DC

## 2015-10-10 NOTE — Addendum Note (Signed)
Addended by: Cheral MarkerBOOKER, Ermalinda Joubert R on: 10/10/2015 04:14 PM   Modules accepted: Orders

## 2015-10-10 NOTE — Patient Instructions (Signed)
[place] Pediatricians/Family Doctors:  [facility] [phone]            [facility] [phone]                 [facility] [phone] (usually not accepting new patients unless you have family there already, you are always welcome to call and ask)            [facility] [phone]) [phone]   [facility]:   [facility]: [phone]  [facility]: [phone]   Call the office [phone]) or go to [facility] if:  You begin to have strong, frequent contractions  Your water breaks.  Sometimes it is a big gush of fluid, sometimes it is just a trickle that keeps getting your panties wet or running down your legs  You have vaginal bleeding.  It is normal to have a small amount of spotting if your cervix was checked.   You don't feel your baby moving like normal.  If you don't, get you something to eat and drink and lay down and focus on feeling your baby move.  You should feel at least 10 movements in 2 hours.  If you don't, you should call the office or go to [facility].   Call the office [phone]) or go to [facility] for these signs of pre-eclampsia:  Severe headache that does not go away with Tylenol  Visual changes- seeing spots, double, blurred vision  Pain under your right breast or upper abdomen that does not go away with Tums or heartburn medicine  Nausea and/or vomiting  Severe swelling in your hands, feet, and face   Tdap Vaccine  It is recommended that you get the Tdap vaccine during the third trimester of EACH pregnancy to help protect your baby from getting pertussis (whooping cough)  27-36 weeks is the BEST time to do this so that you can pass the protection on to your baby. During pregnancy is better than after pregnancy, but if you are unable to get it during pregnancy it will be offered at the hospital.   You  can get this vaccine at the health department or your family doctor  Everyone who will be around your baby should also be up-to-date on their vaccines. Adults (who are not pregnant) only need 1 dose of Tdap during adulthood.      Preterm Labor Information Preterm labor is when labor starts at less than 37 weeks of pregnancy. The normal length of a pregnancy is 39 to 41 weeks. CAUSES Often, there is no identifiable underlying cause as to why a woman goes into preterm labor. One of the most common known causes of preterm labor is infection. Infections of the uterus, cervix, vagina, amniotic sac, bladder, kidney, or even the lungs (pneumonia) can cause labor to start. Other suspected causes of preterm labor include:   Urogenital infections, such as yeast infections and bacterial vaginosis.   Uterine abnormalities (uterine shape, uterine septum, fibroids, or bleeding from the placenta).   A cervix that has been operated on (it may fail to stay closed).   Malformations in the fetus.   Multiple gestations (twins, triplets, and so on).   Breakage of the amniotic sac.  RISK FACTORS  Having a previous history of preterm labor.   Having premature rupture of membranes (PROM).   Having a placenta that covers the opening of the cervix (placenta previa).   Having a placenta that separates  from the uterus (placental abruption).   Having a cervix that is too weak to hold the fetus in the uterus (incompetent cervix).   Having too much fluid in the amniotic sac (polyhydramnios).   Taking illegal drugs or smoking while pregnant.   Not gaining enough weight while pregnant.   Being younger than 67 and older than 31 years old.   Having a low socioeconomic status.   Being African American. SYMPTOMS Signs and symptoms of preterm labor include:   Menstrual-like cramps, abdominal pain, or back pain.  Uterine contractions that are regular, as frequent as six in an hour,  regardless of their intensity (may be mild or painful).  Contractions that start on the top of the uterus and spread down to the lower abdomen and back.   A sense of increased pelvic pressure.   A watery or bloody mucus discharge that comes from the vagina.  TREATMENT Depending on the length of the pregnancy and other circumstances, your health care provider may suggest bed rest. If necessary, there are medicines that can be given to stop contractions and to mature the fetal lungs. If labor happens before 34 weeks of pregnancy, a prolonged hospital stay may be recommended. Treatment depends on the condition of both you and the fetus.  WHAT SHOULD YOU DO IF YOU THINK YOU ARE IN PRETERM LABOR? Call your health care provider right away. You will need to go to the hospital to get checked immediately. HOW CAN YOU PREVENT PRETERM LABOR IN FUTURE PREGNANCIES? You should:   Stop smoking if you smoke.  Maintain healthy weight gain and avoid chemicals and drugs that are not necessary.  Be watchful for any type of infection.  Inform your health care provider if you have a known history of preterm labor.   This information is not intended to replace advice given to you by your health care provider. Make sure you discuss any questions you have with your health care provider.   Document Released: 04/13/2003 Document Revised: 09/23/2012 Document Reviewed: 02/24/2012 Elsevier Interactive Patient Education [employer]

## 2015-10-10 NOTE — Progress Notes (Signed)
Low-risk OB appointment G2P0010 8762w6d Estimated Date of Delivery: 11/08/15 BP (!) 140/94   Pulse 84   Wt 185 lb (83.9 kg)   BMI 28.98 kg/m   BP, weight, and urine reviewed.  Refer to obstetrical flow sheet for FH & FHR.  Reports good fm.  Denies regular uc's, lof, vb, or uti s/s. No complaints. Denies ha, visual changes, ruq/epigastric pain, n/v.   DTRs 2+, no clonus, 2+ BLE edema Reviewed ptl s/s, fkc, pre-e s/s Plan:  Get pre-e labs today, CBC, CMP, urine p:c ratio F/U in 2d for OB appointment/bp check

## 2015-10-11 LAB — CBC
Hematocrit: 35.6 % (ref 34.0–46.6)
Hemoglobin: 12 g/dL (ref 11.1–15.9)
MCH: 29.3 pg (ref 26.6–33.0)
MCHC: 33.7 g/dL (ref 31.5–35.7)
MCV: 87 fL (ref 79–97)
PLATELETS: 161 10*3/uL (ref 150–379)
RBC: 4.09 x10E6/uL (ref 3.77–5.28)
RDW: 12.6 % (ref 12.3–15.4)
WBC: 10.1 10*3/uL (ref 3.4–10.8)

## 2015-10-11 LAB — COMPREHENSIVE METABOLIC PANEL
A/G RATIO: 1.5 (ref 1.2–2.2)
ALBUMIN: 3.6 g/dL (ref 3.5–5.5)
ALK PHOS: 122 IU/L — AB (ref 39–117)
ALT: 14 IU/L (ref 0–32)
AST: 16 IU/L (ref 0–40)
BUN / CREAT RATIO: 14 (ref 9–23)
BUN: 8 mg/dL (ref 6–20)
CHLORIDE: 101 mmol/L (ref 96–106)
CO2: 22 mmol/L (ref 18–29)
Calcium: 8.5 mg/dL — ABNORMAL LOW (ref 8.7–10.2)
Creatinine, Ser: 0.56 mg/dL — ABNORMAL LOW (ref 0.57–1.00)
GFR calc non Af Amer: 126 mL/min/{1.73_m2} (ref >59)
GFR, EST AFRICAN AMERICAN: 145 mL/min/{1.73_m2} (ref >59)
Globulin, Total: 2.4 g/dL (ref 1.5–4.5)
Glucose: 52 mg/dL — ABNORMAL LOW (ref 65–99)
POTASSIUM: 4.3 mmol/L (ref 3.5–5.2)
Sodium: 139 mmol/L (ref 134–144)
TOTAL PROTEIN: 6 g/dL (ref 6.0–8.5)

## 2015-10-11 LAB — PMP SCREEN PROFILE (10S), URINE
Amphetamine Screen, Ur: NEGATIVE ng/mL
BARBITURATE SCRN UR: NEGATIVE ng/mL
BENZODIAZEPINE SCREEN, URINE: NEGATIVE ng/mL
CANNABINOIDS UR QL SCN: POSITIVE ng/mL
Cocaine(Metab.)Screen, Urine: NEGATIVE ng/mL
Creatinine(Crt), U: 227.3 mg/dL (ref 20.0–300.0)
Methadone Scn, Ur: NEGATIVE ng/mL
Opiate Scrn, Ur: NEGATIVE ng/mL
Oxycodone+Oxymorphone Ur Ql Scn: NEGATIVE ng/mL
PCP Scrn, Ur: NEGATIVE ng/mL
PH UR, DRUG SCRN: 8 (ref 4.5–8.9)
Propoxyphene, Screen: NEGATIVE ng/mL

## 2015-10-11 LAB — PROTEIN / CREATININE RATIO, URINE
Creatinine, Urine: 219.6 mg/dL
Protein, Ur: 33.4 mg/dL
Protein/Creat Ratio: 152 mg/g creat (ref 0–200)

## 2015-10-12 ENCOUNTER — Ambulatory Visit (INDEPENDENT_AMBULATORY_CARE_PROVIDER_SITE_OTHER): Payer: Medicaid Other | Admitting: Advanced Practice Midwife

## 2015-10-12 ENCOUNTER — Encounter: Payer: Self-pay | Admitting: Advanced Practice Midwife

## 2015-10-12 VITALS — BP 128/94 | HR 72 | Wt 181.0 lb

## 2015-10-12 DIAGNOSIS — Z1389 Encounter for screening for other disorder: Secondary | ICD-10-CM

## 2015-10-12 DIAGNOSIS — O133 Gestational [pregnancy-induced] hypertension without significant proteinuria, third trimester: Secondary | ICD-10-CM | POA: Insufficient documentation

## 2015-10-12 DIAGNOSIS — Z331 Pregnant state, incidental: Secondary | ICD-10-CM

## 2015-10-12 DIAGNOSIS — Z3493 Encounter for supervision of normal pregnancy, unspecified, third trimester: Secondary | ICD-10-CM

## 2015-10-12 LAB — POCT URINALYSIS DIPSTICK
GLUCOSE UA: NEGATIVE
Ketones, UA: NEGATIVE
LEUKOCYTES UA: NEGATIVE
NITRITE UA: NEGATIVE
Protein, UA: NEGATIVE
RBC UA: NEGATIVE

## 2015-10-12 NOTE — Progress Notes (Signed)
G2P0010 [redacted]w[redacted]d Es76timated Date of Delivery: 11/08/15  Blood pressure (!) 128/94, pulse 72, weight 181 lb (82.1 kg).     Was seen 2 days ago, BP was 140/94.  PreX labs normal.  BP weight and urine results all reviewed and noted. No HA, RUQ pain. 2+ bilateral LE edema.   Please refer to the obstetrical flow sheet for the fundal height and fetal heart rate documentation:  Patient reports good fetal movement, denies any bleeding and no rupture of membranes symptoms or regular contractions. Patient is without complaints. All questions were answered.  Orders Placed This Encounter  Procedures  . US UA Cord Doppler  . US OB Follow Up  . POCT urinalysis dipstick  . Biophysical profile   Technically has GHTN now; mild.   Plan: See twice weekly; IOL 37-39 weeks, preX warning given   Return in about 4 days (around 10/16/2015) for HROB, US:BPP, US:EFW. dopplers

## 2015-10-13 ENCOUNTER — Other Ambulatory Visit: Payer: Self-pay | Admitting: Advanced Practice Midwife

## 2015-10-13 DIAGNOSIS — O133 Gestational [pregnancy-induced] hypertension without significant proteinuria, third trimester: Secondary | ICD-10-CM

## 2015-10-16 ENCOUNTER — Encounter: Payer: Self-pay | Admitting: Obstetrics & Gynecology

## 2015-10-16 ENCOUNTER — Ambulatory Visit (INDEPENDENT_AMBULATORY_CARE_PROVIDER_SITE_OTHER): Payer: Medicaid Other | Admitting: Obstetrics & Gynecology

## 2015-10-16 ENCOUNTER — Ambulatory Visit (INDEPENDENT_AMBULATORY_CARE_PROVIDER_SITE_OTHER): Payer: Medicaid Other

## 2015-10-16 VITALS — BP 120/90 | HR 74 | Wt 178.0 lb

## 2015-10-16 DIAGNOSIS — Z3493 Encounter for supervision of normal pregnancy, unspecified, third trimester: Secondary | ICD-10-CM

## 2015-10-16 DIAGNOSIS — Z1159 Encounter for screening for other viral diseases: Secondary | ICD-10-CM

## 2015-10-16 DIAGNOSIS — Z3685 Encounter for antenatal screening for Streptococcus B: Secondary | ICD-10-CM

## 2015-10-16 DIAGNOSIS — O09893 Supervision of other high risk pregnancies, third trimester: Secondary | ICD-10-CM

## 2015-10-16 DIAGNOSIS — Z331 Pregnant state, incidental: Secondary | ICD-10-CM

## 2015-10-16 DIAGNOSIS — Z3A36 36 weeks gestation of pregnancy: Secondary | ICD-10-CM | POA: Diagnosis not present

## 2015-10-16 DIAGNOSIS — Z118 Encounter for screening for other infectious and parasitic diseases: Secondary | ICD-10-CM

## 2015-10-16 DIAGNOSIS — O0993 Supervision of high risk pregnancy, unspecified, third trimester: Secondary | ICD-10-CM

## 2015-10-16 DIAGNOSIS — O133 Gestational [pregnancy-induced] hypertension without significant proteinuria, third trimester: Secondary | ICD-10-CM | POA: Diagnosis not present

## 2015-10-16 DIAGNOSIS — Z3A37 37 weeks gestation of pregnancy: Secondary | ICD-10-CM

## 2015-10-16 DIAGNOSIS — Z1389 Encounter for screening for other disorder: Secondary | ICD-10-CM

## 2015-10-16 LAB — POCT URINALYSIS DIPSTICK
Blood, UA: 3
GLUCOSE UA: NEGATIVE
KETONES UA: NEGATIVE
Leukocytes, UA: NEGATIVE
Nitrite, UA: NEGATIVE
PROTEIN UA: 2

## 2015-10-16 NOTE — Progress Notes (Signed)
US 36+5 wks,cephalic,fhr 138 bpm,BPP 8/8,normal ov's bilat,post pl gr 2,afi 10.5 cm,RI .49,.51,EFW 3050 g 54%

## 2015-10-16 NOTE — Progress Notes (Signed)
Fetal Surveillance Testing today:  BPP 8/8 with excellent Doppler flow   High Risk Pregnancy Diagnosis(es):   Gestational Hypertension  G2P0010 2755w5d Estimated Date of Delivery: 11/08/15  Blood pressure 120/90, pulse 74, weight 178 lb (80.7 kg).  Urinalysis: Negative   HPI: The patient is being seen today for ongoing management of as above. Today she reports no headache or CNS sx   BP weight and urine results all reviewed and noted. Patient reports good fetal movement, denies any bleeding and no rupture of membranes symptoms or regular contractions.  Fundal Height:  36 Fetal Heart rate:  144 Edema:  none  Patient is without complaints other than noted in her HPI. All questions were answered.  All lab and sonogram results have been reviewed. Comments:    Assessment:  1.  Pregnancy at 6355w5d,  Estimated Date of Delivery: 11/08/15 :                          2.  G HTN                        3.    Medication(s) Plans:  none  Treatment Plan:  Twice weekly testing, delivery at 37-39 weeks depending on clinical course  Return in about 3 days (around 10/19/2015) for NST, HROB. for appointment for high risk OB care  No orders of the defined types were placed in this encounter.  No orders of the defined types were placed in this encounter.

## 2015-10-16 NOTE — Addendum Note (Signed)
Addended by: Federico FlakeNES, Gurinder Toral A on: 10/16/2015 02:49 PM   Modules accepted: Orders

## 2015-10-17 ENCOUNTER — Encounter: Payer: Self-pay | Admitting: Obstetrics & Gynecology

## 2015-10-17 ENCOUNTER — Ambulatory Visit (INDEPENDENT_AMBULATORY_CARE_PROVIDER_SITE_OTHER): Payer: Medicaid Other | Admitting: Obstetrics & Gynecology

## 2015-10-17 VITALS — BP 120/90 | HR 78 | Wt 180.0 lb

## 2015-10-17 DIAGNOSIS — O26853 Spotting complicating pregnancy, third trimester: Secondary | ICD-10-CM | POA: Diagnosis not present

## 2015-10-17 DIAGNOSIS — O133 Gestational [pregnancy-induced] hypertension without significant proteinuria, third trimester: Secondary | ICD-10-CM | POA: Diagnosis not present

## 2015-10-17 DIAGNOSIS — O09893 Supervision of other high risk pregnancies, third trimester: Secondary | ICD-10-CM

## 2015-10-17 DIAGNOSIS — Z1389 Encounter for screening for other disorder: Secondary | ICD-10-CM

## 2015-10-17 DIAGNOSIS — Z331 Pregnant state, incidental: Secondary | ICD-10-CM

## 2015-10-17 DIAGNOSIS — Z3A37 37 weeks gestation of pregnancy: Secondary | ICD-10-CM

## 2015-10-17 LAB — POCT URINALYSIS DIPSTICK
Blood, UA: 3
GLUCOSE UA: NEGATIVE
KETONES UA: NEGATIVE
Nitrite, UA: NEGATIVE
PROTEIN UA: 1

## 2015-10-17 NOTE — Progress Notes (Signed)
Work in ob visit  Had exam yesterday and has had spotting throughtout since, no thing period like spotting whrn wiped and on panty liner Good fetal movement, no LOF No contractions but back and pelvic pressure  SSE no blood in vault  Normal post cervical exam spotting  Keep scheduled appt

## 2015-10-18 LAB — GC/CHLAMYDIA PROBE AMP
Chlamydia trachomatis, NAA: NEGATIVE
Neisseria gonorrhoeae by PCR: NEGATIVE

## 2015-10-18 LAB — STREP GP B NAA: STREP GROUP B AG: NEGATIVE

## 2015-10-19 ENCOUNTER — Encounter: Payer: Self-pay | Admitting: Obstetrics & Gynecology

## 2015-10-19 ENCOUNTER — Ambulatory Visit (INDEPENDENT_AMBULATORY_CARE_PROVIDER_SITE_OTHER): Payer: Medicaid Other | Admitting: Obstetrics & Gynecology

## 2015-10-19 VITALS — BP 120/80 | HR 88 | Wt 177.0 lb

## 2015-10-19 DIAGNOSIS — O09893 Supervision of other high risk pregnancies, third trimester: Secondary | ICD-10-CM

## 2015-10-19 DIAGNOSIS — Z331 Pregnant state, incidental: Secondary | ICD-10-CM

## 2015-10-19 DIAGNOSIS — O133 Gestational [pregnancy-induced] hypertension without significant proteinuria, third trimester: Secondary | ICD-10-CM | POA: Diagnosis not present

## 2015-10-19 DIAGNOSIS — Z1389 Encounter for screening for other disorder: Secondary | ICD-10-CM

## 2015-10-19 DIAGNOSIS — O0993 Supervision of high risk pregnancy, unspecified, third trimester: Secondary | ICD-10-CM

## 2015-10-19 DIAGNOSIS — Z3A38 38 weeks gestation of pregnancy: Secondary | ICD-10-CM

## 2015-10-19 LAB — POCT URINALYSIS DIPSTICK
Glucose, UA: NEGATIVE
Ketones, UA: NEGATIVE
LEUKOCYTES UA: NEGATIVE
NITRITE UA: NEGATIVE
PROTEIN UA: 1
RBC UA: NEGATIVE

## 2015-10-19 NOTE — Progress Notes (Signed)
Fetal Surveillance Testing today:  Reactive NST   High Risk Pregnancy Diagnosis(es):   Gestational Hypertension  G2P0010 1735w1d Estimated Date of Delivery: 11/08/15  Blood pressure 120/80, pulse 88, weight 177 lb (80.3 kg).  Urinalysis: Negative   HPI: The patient is being seen today for ongoing management of gestational hypertension. Today she reports no problems   BP weight and urine results all reviewed and noted. Patient reports good fetal movement, denies any bleeding and no rupture of membranes symptoms or regular contractions.  Fundal Height:  36 Fetal Heart rate:  135 Edema:  2+  Patient is without complaints other than noted in her HPI. All questions were answered.  All lab and sonogram results have been reviewed. Comments:    Assessment:  1.  Pregnancy at 4435w1d,  Estimated Date of Delivery: 11/08/15 :                          2.  Gestational Hypertension                        3.    Medication(s) Plans:  None at present  Treatment Plan:  Twice weekly surveillance, induction 39 weeks  Return in about 4 days (around 10/23/2015) for BPP/sono, HROB, with Dr Despina HiddenEure. for appointment for high risk OB care  No orders of the defined types were placed in this encounter.  Orders Placed This Encounter  Procedures  . US UA Cord Doppler  . US Fetal BPP W/O Non Stress  . POCT urinalysis dipstick

## 2015-10-22 ENCOUNTER — Encounter (HOSPITAL_COMMUNITY): Payer: Self-pay | Admitting: Certified Nurse Midwife

## 2015-10-22 ENCOUNTER — Inpatient Hospital Stay (HOSPITAL_COMMUNITY)
Admission: AD | Admit: 2015-10-22 | Discharge: 2015-10-25 | DRG: 775 | Disposition: A | Discharge status: Home / Self Care | Payer: Medicaid Other | Source: Ambulatory Visit | Attending: Family Medicine | Admitting: Family Medicine

## 2015-10-22 DIAGNOSIS — F419 Anxiety disorder, unspecified: Secondary | ICD-10-CM | POA: Diagnosis present

## 2015-10-22 DIAGNOSIS — O134 Gestational [pregnancy-induced] hypertension without significant proteinuria, complicating childbirth: Secondary | ICD-10-CM | POA: Diagnosis present

## 2015-10-22 DIAGNOSIS — O133 Gestational [pregnancy-induced] hypertension without significant proteinuria, third trimester: Secondary | ICD-10-CM

## 2015-10-22 DIAGNOSIS — O139 Gestational [pregnancy-induced] hypertension without significant proteinuria, unspecified trimester: Secondary | ICD-10-CM | POA: Diagnosis present

## 2015-10-22 DIAGNOSIS — F1290 Cannabis use, unspecified, uncomplicated: Secondary | ICD-10-CM | POA: Diagnosis present

## 2015-10-22 DIAGNOSIS — R030 Elevated blood-pressure reading, without diagnosis of hypertension: Secondary | ICD-10-CM | POA: Diagnosis present

## 2015-10-22 DIAGNOSIS — F17210 Nicotine dependence, cigarettes, uncomplicated: Secondary | ICD-10-CM | POA: Diagnosis present

## 2015-10-22 DIAGNOSIS — O99334 Smoking (tobacco) complicating childbirth: Secondary | ICD-10-CM | POA: Diagnosis present

## 2015-10-22 DIAGNOSIS — O99324 Drug use complicating childbirth: Secondary | ICD-10-CM | POA: Diagnosis present

## 2015-10-22 DIAGNOSIS — O99344 Other mental disorders complicating childbirth: Secondary | ICD-10-CM | POA: Diagnosis present

## 2015-10-22 DIAGNOSIS — Z3A37 37 weeks gestation of pregnancy: Secondary | ICD-10-CM | POA: Diagnosis not present

## 2015-10-22 HISTORY — DX: Depression, unspecified: F32.A

## 2015-10-22 HISTORY — DX: Anxiety disorder, unspecified: F41.9

## 2015-10-22 HISTORY — DX: Mental disorder, not otherwise specified: F99

## 2015-10-22 HISTORY — DX: Major depressive disorder, single episode, unspecified: F32.9

## 2015-10-22 LAB — TYPE AND SCREEN
ABO/RH(D): O POS
Antibody Screen: NEGATIVE

## 2015-10-22 LAB — COMPREHENSIVE METABOLIC PANEL
ALBUMIN: 3.3 g/dL — AB (ref 3.5–5.0)
ALT: 15 U/L (ref 14–54)
AST: 18 U/L (ref 15–41)
Alkaline Phosphatase: 128 U/L — ABNORMAL HIGH (ref 38–126)
Anion gap: 5 (ref 5–15)
BILIRUBIN TOTAL: 0.6 mg/dL (ref 0.3–1.2)
BUN: 9 mg/dL (ref 6–20)
CO2: 21 mmol/L — ABNORMAL LOW (ref 22–32)
CREATININE: 0.48 mg/dL (ref 0.44–1.00)
Calcium: 8.7 mg/dL — ABNORMAL LOW (ref 8.9–10.3)
Chloride: 108 mmol/L (ref 101–111)
GFR calc Af Amer: >60 mL/min (ref >60)
GLUCOSE: 95 mg/dL (ref 65–99)
POTASSIUM: 3.7 mmol/L (ref 3.5–5.1)
Sodium: 134 mmol/L — ABNORMAL LOW (ref 135–145)
TOTAL PROTEIN: 6.4 g/dL — AB (ref 6.5–8.1)

## 2015-10-22 LAB — PROTEIN / CREATININE RATIO, URINE
Creatinine, Urine: 295 mg/dL
PROTEIN CREATININE RATIO: 0.19 mg/mg{creat} — AB (ref 0.00–0.15)
Total Protein, Urine: 57 mg/dL

## 2015-10-22 LAB — RAPID URINE DRUG SCREEN, HOSP PERFORMED
AMPHETAMINES: NOT DETECTED
Barbiturates: NOT DETECTED
Benzodiazepines: NOT DETECTED
Cocaine: NOT DETECTED
OPIATES: NOT DETECTED
TETRAHYDROCANNABINOL: POSITIVE — AB

## 2015-10-22 LAB — URINALYSIS, ROUTINE W REFLEX MICROSCOPIC
Bilirubin Urine: NEGATIVE
GLUCOSE, UA: NEGATIVE mg/dL
HGB URINE DIPSTICK: NEGATIVE
Ketones, ur: NEGATIVE mg/dL
LEUKOCYTES UA: NEGATIVE
Nitrite: NEGATIVE
PH: 6 (ref 5.0–8.0)
Protein, ur: 100 mg/dL — AB
SPECIFIC GRAVITY, URINE: 1.025 (ref 1.005–1.030)

## 2015-10-22 LAB — ABO/RH: ABO/RH(D): O POS

## 2015-10-22 LAB — CBC
HEMATOCRIT: 35.3 % — AB (ref 36.0–46.0)
HEMOGLOBIN: 12.1 g/dL (ref 12.0–15.0)
MCH: 29.3 pg (ref 26.0–34.0)
MCHC: 34.3 g/dL (ref 30.0–36.0)
MCV: 85.5 fL (ref 78.0–100.0)
Platelets: 149 10*3/uL — ABNORMAL LOW (ref 150–400)
RBC: 4.13 MIL/uL (ref 3.87–5.11)
RDW: 12.4 % (ref 11.5–15.5)
WBC: 9.4 10*3/uL (ref 4.0–10.5)

## 2015-10-22 LAB — URINE MICROSCOPIC-ADD ON

## 2015-10-22 MED ORDER — TERBUTALINE SULFATE 1 MG/ML IJ SOLN: 0.2500 mg | Freq: Once | INTRAMUSCULAR | Status: DC | PRN

## 2015-10-22 MED ORDER — OXYTOCIN 40 UNITS IN LACTATED RINGERS INFUSION - SIMPLE MED
2.5000 [IU]/h | INTRAVENOUS | Status: DC
  Administered 2015-10-23: 2.5 [IU]/h via INTRAVENOUS

## 2015-10-22 MED ORDER — ACETAMINOPHEN 325 MG PO TABS: 650.0000 mg | ORAL_TABLET | ORAL | Status: DC | PRN

## 2015-10-22 MED ORDER — SODIUM CHLORIDE 0.9 % IV SOLN: 250.0000 mL | INTRAVENOUS | Status: DC | PRN

## 2015-10-22 MED ORDER — ONDANSETRON HCL 4 MG/2ML IJ SOLN
4.0000 mg | Freq: Four times a day (QID) | INTRAMUSCULAR | Status: DC | PRN
  Administered 2015-10-23: 4 mg via INTRAVENOUS
  Filled 2015-10-22: qty 2

## 2015-10-22 MED ORDER — SODIUM CHLORIDE 0.9% FLUSH: 3.0000 mL | INTRAVENOUS | Status: DC | PRN

## 2015-10-22 MED ORDER — LIDOCAINE HCL (PF) 1 % IJ SOLN
30.0000 mL | INTRAMUSCULAR | Status: DC | PRN
  Administered 2015-10-23: 30 mL via SUBCUTANEOUS
  Filled 2015-10-22: qty 30

## 2015-10-22 MED ORDER — OXYTOCIN BOLUS FROM INFUSION
500.0000 mL | Freq: Once | INTRAVENOUS | Status: AC
  Administered 2015-10-23: 500 mL via INTRAVENOUS

## 2015-10-22 MED ORDER — LACTATED RINGERS IV SOLN
INTRAVENOUS | Status: DC
  Administered 2015-10-22 (×2): via INTRAVENOUS
  Administered 2015-10-23 (×2): 125 mL/h via INTRAVENOUS

## 2015-10-22 MED ORDER — OXYCODONE-ACETAMINOPHEN 5-325 MG PO TABS: 2.0000 | ORAL_TABLET | ORAL | Status: DC | PRN

## 2015-10-22 MED ORDER — LACTATED RINGERS IV SOLN
500.0000 mL | INTRAVENOUS | Status: DC | PRN
  Administered 2015-10-23: 500 mL via INTRAVENOUS

## 2015-10-22 MED ORDER — FENTANYL CITRATE (PF) 100 MCG/2ML IJ SOLN
50.0000 ug | INTRAMUSCULAR | Status: DC | PRN
  Administered 2015-10-23: 100 ug via INTRAVENOUS
  Filled 2015-10-22: qty 2

## 2015-10-22 MED ORDER — MISOPROSTOL 25 MCG QUARTER TABLET
25.0000 ug | ORAL_TABLET | ORAL | Status: DC | PRN
  Administered 2015-10-22: 25 ug via BUCCAL
  Filled 2015-10-22: qty 1
  Filled 2015-10-22: qty 0.25

## 2015-10-22 MED ORDER — HYDROXYZINE HCL 50 MG PO TABS
50.0000 mg | ORAL_TABLET | Freq: Four times a day (QID) | ORAL | Status: DC | PRN
  Filled 2015-10-22: qty 1

## 2015-10-22 MED ORDER — OXYCODONE-ACETAMINOPHEN 5-325 MG PO TABS: 1.0000 | ORAL_TABLET | ORAL | Status: DC | PRN

## 2015-10-22 MED ORDER — SODIUM CHLORIDE 0.9% FLUSH: 3.0000 mL | Freq: Two times a day (BID) | INTRAVENOUS | Status: DC

## 2015-10-22 MED ORDER — OXYTOCIN 10 UNIT/ML IJ SOLN: 10.0000 [IU] | Freq: Once | INTRAMUSCULAR | Status: DC

## 2015-10-22 NOTE — MAU Note (Signed)
Pt states she has been taking her BP at home and today it has been high. Pt denies HA, blurred vision, and epigastric pain. Pt states she hasn't felt the baby move all morning. Pt denies ctxs, LOF or vaginal bleeding.

## 2015-10-22 NOTE — MAU Note (Signed)
Notified Renetta ChalkAshley Ellis CNM Student of patient's BP readings.

## 2015-10-22 NOTE — H&P (Signed)
Shelley [name] is a 30 y.o. female presenting for elevated BP's at home. Pt clinically being monitored for GHTN. States has taken her BP at home today and has been elevated also has not felt fetal movement since last night.. OB History    Gravida Para Term Preterm AB Living   2       1 0   SAB TAB Ectopic Multiple Live Births   1             History reviewed. No pertinent past medical history. Past Surgical History:  Procedure Laterality Date  . DILATION AND CURETTAGE OF UTERUS    . HAND SURGERY     Left pinky finger   Family History: family history is not on file. Social History:  reports that she has been smoking Cigarettes.  She has been smoking about 0.25 packs per day. She has never used smokeless tobacco. She reports that she does not drink alcohol or use drugs.     Maternal Diabetes: No Genetic Screening: Normal Maternal Ultrasounds/Referrals: Normal Fetal Ultrasounds or other Referrals:  None Maternal Substance Abuse:  No Significant Maternal Medications:  None Significant Maternal Lab Results:  None Other Comments:  None  ROS Maternal Medical History:  Reason for admission: GHTN  Contractions: none  Fetal activity: Perceived fetal activity is none.   Last perceived fetal movement was within the past 24 hours.    Prenatal complications: PIH.   Prenatal Complications - Diabetes: none.    Dilation: Closed Exam by:: D. Hart RochesterLawson CNM Blood pressure 138/99, pulse 100, temperature 98.8 F (37.1 C), temperature source Oral, resp. rate 18. Maternal Exam:  Uterine Assessment: none  Abdomen: Patient reports no abdominal tenderness. Fetal presentation: vertex  Introitus: Normal vulva. Normal vagina.  Cervix: Cervix evaluated by digital exam.     Fetal Exam Fetal Monitor Review: Mode: ultrasound.   Variability: moderate (6-25 bpm).   Pattern: accelerations present.    Fetal State Assessment: Category I - tracings are normal.     Physical Exam   Constitutional: She is oriented to person, place, and time. She appears well-developed and well-nourished.  HENT:  Head: Normocephalic.  Eyes: Pupils are equal, round, and reactive to light.  Neck: Normal range of motion.  Cardiovascular: Normal rate, regular rhythm, normal heart sounds and intact distal pulses.   Respiratory: Effort normal and breath sounds normal.  GI: Soft. Bowel sounds are normal.  Genitourinary: Vagina normal and uterus normal.  Musculoskeletal: Normal range of motion.  Neurological: She is alert and oriented to person, place, and time. She has normal reflexes.  Skin: Skin is warm and dry.  Psychiatric: She has a normal mood and affect. Her behavior is normal. Judgment and thought content normal.    Prenatal labs: ABO, Rh: O/Positive/-- (03/08 0000) Antibody: Negative (07/12 0920) Rubella: Immune (03/08 0000) RPR: Non Reactive (07/12 0920)  HBsAg: Negative (03/08 0000)  HIV: Non Reactive (07/12 0920)  GBS: Negative (09/11 1649)   Assessment/Plan: SVE cl/th/post/high. POC discussed with Dr. Jolayne Pantheronstant  And [facility]   Wyvonnia DuskyMarie Lawson 10/22/2015, 1:18 PM

## 2015-10-22 NOTE — Anesthesia Pain Management Evaluation Note (Signed)
  [facility]NA Pain Management Visit Note  Patient: Shelley [name], 30 y.o., female  "Hello I am a [facility] of the anesthesia team at [facility]. We have an anesthesia team available at all times to provide care throughout the hospital, including epidural management and anesthesia for C-section. I don't know your plan for the delivery whether it a natural birth, water birth, IV sedation, nitrous supplementation, doula or epidural, but we want to meet your pain goals."   1.Was your pain managed to your expectations on prior hospitalizations?   No prior hospitalizations  2.What is your expectation for pain management during this hospitalization?     IV pain meds and Nitrous Oxide  3.How can we help you reach that goal? Be available if needed  Record the patient's initial score and the patient's pain goal.   Pain: 0  Pain Goal: 9 [facility] wants you to be able to say your pain was always managed very well.  Brionna Romanek 10/22/2015

## 2015-10-22 NOTE — Progress Notes (Signed)
LABOR PROGRESS NOTE  Shelley [name] is a 30 y.o. G2P0010 at 6241w4d  admitted for IOL for gestational HTN  Subjective: Patient doing well  Objective: BP 136/81 (BP Location: Left Arm)   Pulse 66   Temp 98 F (36.7 C) (Oral)   Resp 18   Ht 5\' 7"  (1.702 m)   Wt 180 lb (81.6 kg)   BMI 28.19 kg/m  or  Vitals:   10/22/15 1946 10/22/15 2059 10/22/15 2159 10/22/15 2300  BP: (!) 141/86 (!) 147/93 (!) 144/92 136/81  Pulse: 66 74 69 66  Resp: 18 18 18 18   Temp: 98 F (36.7 C)     TempSrc: Oral     Weight:      Height:        Sitting in bed in NAD Dilation: 1.5 Effacement (%): 70 Station: -2 Presentation: Vertex Exam by:: Gifford ShaveYancey Luft RN  FHT: baseline rate 135, moderate variability, +acle, no decel Toco: every 3-7 min  Labs: Lab Results  Component Value Date   WBC 9.4 10/22/2015   HGB 12.1 10/22/2015   HCT 35.3 (L) 10/22/2015   MCV 85.5 10/22/2015   PLT 149 (L) 10/22/2015    Patient Active Problem List   Diagnosis Date Noted  . Gestational hypertension 10/22/2015  . Gestational hypertension w/o significant proteinuria in 3rd trimester 10/12/2015  . Smoker 10/10/2015  . Anxiety 10/10/2015  . Marijuana use 07/19/2015  . Supervision of normal pregnancy 04/25/2015    Assessment / Plan: 30 y.o. G2P0010 at 4241w4d here for IOL for GHTN. BP have been mild range  Labor: cervical ripening cytotec, and FB (placed at 1915) Fetal Wellbeing:  Category I Pain Control:  May have epidural when in active labor if patient desires Anticipated MOD:  SVD  Frederik PearJulie P Degele, MD 10/22/2015, 11:44 PM

## 2015-10-23 ENCOUNTER — Inpatient Hospital Stay (HOSPITAL_COMMUNITY): Payer: Medicaid Other | Admitting: Anesthesiology

## 2015-10-23 ENCOUNTER — Encounter (HOSPITAL_COMMUNITY): Payer: Self-pay

## 2015-10-23 ENCOUNTER — Encounter: Payer: Medicaid Other | Admitting: Obstetrics & Gynecology

## 2015-10-23 ENCOUNTER — Other Ambulatory Visit: Payer: Medicaid Other

## 2015-10-23 DIAGNOSIS — O99324 Drug use complicating childbirth: Secondary | ICD-10-CM

## 2015-10-23 DIAGNOSIS — O134 Gestational [pregnancy-induced] hypertension without significant proteinuria, complicating childbirth: Secondary | ICD-10-CM

## 2015-10-23 DIAGNOSIS — O99334 Smoking (tobacco) complicating childbirth: Secondary | ICD-10-CM

## 2015-10-23 DIAGNOSIS — Z3A37 37 weeks gestation of pregnancy: Secondary | ICD-10-CM

## 2015-10-23 DIAGNOSIS — O99344 Other mental disorders complicating childbirth: Secondary | ICD-10-CM

## 2015-10-23 LAB — CBC
HCT: 33.9 % — ABNORMAL LOW (ref 36.0–46.0)
HEMATOCRIT: 34.4 % — AB (ref 36.0–46.0)
HEMOGLOBIN: 11.6 g/dL — AB (ref 12.0–15.0)
Hemoglobin: 11.9 g/dL — ABNORMAL LOW (ref 12.0–15.0)
MCH: 29.2 pg (ref 26.0–34.0)
MCH: 29.1 pg (ref 26.0–34.0)
MCHC: 34.6 g/dL (ref 30.0–36.0)
MCHC: 34.2 g/dL (ref 30.0–36.0)
MCV: 84.5 fL (ref 78.0–100.0)
MCV: 85.2 fL (ref 78.0–100.0)
PLATELETS: 152 10*3/uL (ref 150–400)
PLATELETS: 143 10*3/uL — AB (ref 150–400)
RBC: 4.07 MIL/uL (ref 3.87–5.11)
RBC: 3.98 MIL/uL (ref 3.87–5.11)
RDW: 12.4 % (ref 11.5–15.5)
RDW: 12.4 % (ref 11.5–15.5)
WBC: 13.5 10*3/uL — ABNORMAL HIGH (ref 4.0–10.5)
WBC: 31.9 10*3/uL — AB (ref 4.0–10.5)

## 2015-10-23 LAB — RPR: RPR: NONREACTIVE

## 2015-10-23 MED ORDER — EPHEDRINE 5 MG/ML INJ: 10.0000 mg | INTRAVENOUS | Status: DC | PRN

## 2015-10-23 MED ORDER — ONDANSETRON HCL 4 MG PO TABS: 4.0000 mg | ORAL_TABLET | ORAL | Status: DC | PRN

## 2015-10-23 MED ORDER — GENTAMICIN SULFATE 40 MG/ML IJ SOLN
180.0000 mg | Freq: Three times a day (TID) | INTRAVENOUS | Status: DC
  Administered 2015-10-23: 180 mg via INTRAVENOUS
  Filled 2015-10-23 (×2): qty 4.5

## 2015-10-23 MED ORDER — BENZOCAINE-MENTHOL 20-0.5 % EX AERO
1.0000 "application " | INHALATION_SPRAY | CUTANEOUS | Status: DC | PRN
  Administered 2015-10-24: 1 via TOPICAL
  Filled 2015-10-23: qty 56

## 2015-10-23 MED ORDER — SENNOSIDES-DOCUSATE SODIUM 8.6-50 MG PO TABS
2.0000 | ORAL_TABLET | ORAL | Status: DC
  Administered 2015-10-24 (×2): 2 via ORAL
  Filled 2015-10-23 (×2): qty 2

## 2015-10-23 MED ORDER — PRENATAL MULTIVITAMIN CH
1.0000 | ORAL_TABLET | Freq: Every day | ORAL | Status: DC
  Administered 2015-10-24 – 2015-10-25 (×2): 1 via ORAL
  Filled 2015-10-23 (×2): qty 1

## 2015-10-23 MED ORDER — ACETAMINOPHEN 500 MG PO TABS
1000.0000 mg | ORAL_TABLET | Freq: Once | ORAL | Status: AC
  Administered 2015-10-23: 1000 mg via ORAL
  Filled 2015-10-23: qty 2

## 2015-10-23 MED ORDER — DIPHENHYDRAMINE HCL 50 MG/ML IJ SOLN: 12.5000 mg | INTRAMUSCULAR | Status: DC | PRN

## 2015-10-23 MED ORDER — FENTANYL 2.5 MCG/ML BUPIVACAINE 1/10 % EPIDURAL INFUSION (WH - ANES)
14.0000 mL/h | INTRAMUSCULAR | Status: DC | PRN
  Administered 2015-10-23 (×3): 14 mL/h via EPIDURAL
  Filled 2015-10-23 (×3): qty 125

## 2015-10-23 MED ORDER — PHENYLEPHRINE 40 MCG/ML (10ML) SYRINGE FOR IV PUSH (FOR BLOOD PRESSURE SUPPORT)
80.0000 ug | PREFILLED_SYRINGE | INTRAVENOUS | Status: DC | PRN
  Filled 2015-10-23: qty 5

## 2015-10-23 MED ORDER — ZOLPIDEM TARTRATE 5 MG PO TABS: 5.0000 mg | ORAL_TABLET | Freq: Every evening | ORAL | Status: DC | PRN

## 2015-10-23 MED ORDER — SIMETHICONE 80 MG PO CHEW: 80.0000 mg | CHEWABLE_TABLET | ORAL | Status: DC | PRN

## 2015-10-23 MED ORDER — LIDOCAINE HCL (PF) 1 % IJ SOLN
INTRAMUSCULAR | Status: DC | PRN
  Administered 2015-10-23 (×2): 5 mL

## 2015-10-23 MED ORDER — TERBUTALINE SULFATE 1 MG/ML IJ SOLN
0.2500 mg | Freq: Once | INTRAMUSCULAR | Status: DC | PRN
  Filled 2015-10-23: qty 1

## 2015-10-23 MED ORDER — PHENYLEPHRINE 40 MCG/ML (10ML) SYRINGE FOR IV PUSH (FOR BLOOD PRESSURE SUPPORT): 80.0000 ug | PREFILLED_SYRINGE | INTRAVENOUS | Status: DC | PRN

## 2015-10-23 MED ORDER — EPHEDRINE 5 MG/ML INJ
10.0000 mg | INTRAVENOUS | Status: DC | PRN
  Filled 2015-10-23: qty 4

## 2015-10-23 MED ORDER — FENTANYL 2.5 MCG/ML BUPIVACAINE 1/10 % EPIDURAL INFUSION (WH - ANES): 14.0000 mL/h | INTRAMUSCULAR | Status: DC | PRN

## 2015-10-23 MED ORDER — PHENYLEPHRINE 40 MCG/ML (10ML) SYRINGE FOR IV PUSH (FOR BLOOD PRESSURE SUPPORT)
80.0000 ug | PREFILLED_SYRINGE | INTRAVENOUS | Status: DC | PRN
  Filled 2015-10-23: qty 5
  Filled 2015-10-23 (×2): qty 10

## 2015-10-23 MED ORDER — SODIUM BICARBONATE 8.4 % IV SOLN
INTRAVENOUS | Status: DC | PRN
  Administered 2015-10-23: 7 mL via EPIDURAL

## 2015-10-23 MED ORDER — DIBUCAINE 1 % RE OINT: 1.0000 "application " | TOPICAL_OINTMENT | RECTAL | Status: DC | PRN

## 2015-10-23 MED ORDER — SODIUM CHLORIDE 0.9 % IV SOLN
2.0000 g | Freq: Four times a day (QID) | INTRAVENOUS | Status: DC
  Filled 2015-10-23 (×2): qty 2000

## 2015-10-23 MED ORDER — ONDANSETRON HCL 4 MG/2ML IJ SOLN: 4.0000 mg | INTRAMUSCULAR | Status: DC | PRN

## 2015-10-23 MED ORDER — COCONUT OIL OIL
1.0000 "application " | TOPICAL_OIL | Status: DC | PRN
  Administered 2015-10-24: 1 via TOPICAL
  Filled 2015-10-23: qty 120

## 2015-10-23 MED ORDER — WITCH HAZEL-GLYCERIN EX PADS: 1.0000 "application " | MEDICATED_PAD | CUTANEOUS | Status: DC | PRN

## 2015-10-23 MED ORDER — LACTATED RINGERS IV SOLN: 500.0000 mL | Freq: Once | INTRAVENOUS | Status: DC

## 2015-10-23 MED ORDER — TETANUS-DIPHTH-ACELL PERTUSSIS 5-2.5-18.5 LF-MCG/0.5 IM SUSP: 0.5000 mL | Freq: Once | INTRAMUSCULAR | Status: DC

## 2015-10-23 MED ORDER — ACETAMINOPHEN 325 MG PO TABS
650.0000 mg | ORAL_TABLET | ORAL | Status: DC | PRN
  Administered 2015-10-24: 650 mg via ORAL
  Filled 2015-10-23: qty 2

## 2015-10-23 MED ORDER — OXYTOCIN 40 UNITS IN LACTATED RINGERS INFUSION - SIMPLE MED
1.0000 m[IU]/min | INTRAVENOUS | Status: DC
  Administered 2015-10-23: 2 m[IU]/min via INTRAVENOUS
  Filled 2015-10-23: qty 1000

## 2015-10-23 MED ORDER — LACTATED RINGERS IV SOLN
500.0000 mL | Freq: Once | INTRAVENOUS | Status: AC
  Administered 2015-10-23: 500 mL via INTRAVENOUS

## 2015-10-23 MED ORDER — IBUPROFEN 600 MG PO TABS
600.0000 mg | ORAL_TABLET | Freq: Four times a day (QID) | ORAL | Status: DC
  Administered 2015-10-23 – 2015-10-25 (×7): 600 mg via ORAL
  Filled 2015-10-23 (×7): qty 1

## 2015-10-23 MED ORDER — DIPHENHYDRAMINE HCL 25 MG PO CAPS: 25.0000 mg | ORAL_CAPSULE | Freq: Four times a day (QID) | ORAL | Status: DC | PRN

## 2015-10-23 NOTE — Progress Notes (Signed)
Called by nurse for epidural dosing. Patient has been complete and pushing for awhile. Epidural has been working.  Dosed with lidocaine 2% with epinephrine 1:200,000   (7 cc). Sherri [phone] RN at bedside. Observed for 10 minutes. BP stable.

## 2015-10-23 NOTE — Progress Notes (Signed)
Patient ID: Shelley [name], female   DOB: [birth year], 30 y.o.   MRN: [id number] LABOR PROGRESS NOTE  Shelley [name] is a 30 y.o. G2P0010 at 3756w5d  admitted for IOL for gHTN  Subjective: Doing well  Objective: BP (!) 138/100   Pulse 99   Temp 97.9 F (36.6 C) (Oral)   Resp 15   Ht 5\' 7"  (1.702 m)   Wt 81.6 kg (180 lb)   SpO2 99%   BMI 28.19 kg/m  or  Vitals:   10/23/15 1001 10/23/15 1031 10/23/15 1101 10/23/15 1131  BP: 115/75 119/76 (!) 144/89 (!) 138/100  Pulse: 67 76 87 99  Resp: 15 16 16 15   Temp:      TempSrc:      SpO2:      Weight:      Height:         Dilation: Lip/rim Effacement (%): 100 Station: 0 Presentation: Vertex Exam by:: Dr Genevie AnnSchenk FHT: 150 baseline, moderate variability, +accelerations, no decelerations Uterine activity: q2-573min  Labs: Lab Results  Component Value Date   WBC 13.5 (H) 10/23/2015   HGB 11.9 (L) 10/23/2015   HCT 34.4 (L) 10/23/2015   MCV 84.5 10/23/2015   PLT 152 10/23/2015    Patient Active Problem List   Diagnosis Date Noted  . Gestational hypertension 10/22/2015  . Gestational hypertension w/o significant proteinuria in 3rd trimester 10/12/2015  . Smoker 10/10/2015  . Anxiety 10/10/2015  . Marijuana use 07/19/2015  . Supervision of normal pregnancy 04/25/2015    Assessment / Plan: 30 y.o. G2P0010 at 2756w5d here for IOL for gHTN  Labor: now AROM to clear Fetal Wellbeing:  Category I Pain Control:  epidural Anticipated MOD:  SVD  [name], DO PGY-1 9/18/201711:54 AM

## 2015-10-23 NOTE — Progress Notes (Signed)
Pt denies feeling any pressure

## 2015-10-23 NOTE — Anesthesia Procedure Notes (Signed)
Epidural Patient location during procedure: OB Start time: 10/23/2015 5:00 AM End time: 10/23/2015 5:03 AM  Staffing Anesthesiologist: Bonita QuinGUIDETTI, Shelley Ottley S Performed: anesthesiologist   Preanesthetic Checklist Completed: patient identified, site marked, surgical consent, pre-op evaluation, timeout performed, IV checked, risks and benefits discussed and monitors and equipment checked  Epidural Patient position: sitting Prep: site prepped and draped and DuraPrep Patient monitoring: continuous pulse ox and blood pressure Approach: midline Location: L4-L5 Injection technique: LOR air  Needle:  Needle type: Tuohy  Needle gauge: 17 G Needle length: 9 cm and 9 Needle insertion depth: 6 cm Catheter type: closed end flexible Catheter size: 19 Gauge Catheter at skin depth: 11 cm Test dose: negative  Assessment Events: blood not aspirated, injection not painful, no injection resistance, negative IV test and no paresthesia

## 2015-10-23 NOTE — Progress Notes (Signed)
LABOR PROGRESS NOTE  Shelley [name] is a 30 y.o. G2P0010 at 6711w4d  admitted for IOL for gestational HTN  Subjective: Patient feeling stronger contractions, wants epidural.  Objective: BP 136/81 (BP Location: Left Arm)   Pulse 66   Temp 98 F (36.7 C) (Oral)   Resp 16   Ht 5\' 7"  (1.702 m)   Wt 180 lb (81.6 kg)   BMI 28.19 kg/m  or  Vitals:   10/22/15 2059 10/22/15 2159 10/22/15 2300 10/23/15 0057  BP: (!) 147/93 (!) 144/92 136/81   Pulse: 74 69 66   Resp: 18 18 18 16   Temp:      TempSrc:      Weight:      Height:        Sitting in bed in NAD Dilation: 4.5 Effacement (%): 80 Station: -2 Presentation: Vertex Exam by:: Marquis Buggyaroline Jones, RN   FHT: baseline rate 135, moderate variability, +acle, occasioal variable  Toco: every 2-396min  Labs: Lab Results  Component Value Date   WBC 9.4 10/22/2015   HGB 12.1 10/22/2015   HCT 35.3 (L) 10/22/2015   MCV 85.5 10/22/2015   PLT 149 (L) 10/22/2015    Patient Active Problem List   Diagnosis Date Noted  . Gestational hypertension 10/22/2015  . Gestational hypertension w/o significant proteinuria in 3rd trimester 10/12/2015  . Smoker 10/10/2015  . Anxiety 10/10/2015  . Marijuana use 07/19/2015  . Supervision of normal pregnancy 04/25/2015    Assessment / Plan: 30 y.o. G2P0010 at 6011w4d here for IOL for GHTN. BP have been mild range  Labor: s/p Fb, now out. Startinf pitocin Fetal Wellbeing:  Category II Pain Control:  Pt may have epidural Anticipated MOD:  SVD  Frederik PearJulie P Courtez Twaddle, MD 10/23/2015, 4:06 AM

## 2015-10-23 NOTE — Progress Notes (Signed)
Patient ID: Shelley Cancerlizabeth Weisenburger, female   DOB: 02/18/1985, 30 y.o.   MRN: [id number] LABOR PROGRESS NOTE  Shelley [name] is a 30 y.o. G2P0010 at 8222w5d  admitted for IOL for gHTN  Subjective: Feeling pressure and pushing  Objective: BP 136/79   Pulse 74   Temp 99.4 F (37.4 C) (Axillary)   Resp 16   Ht 5\' 7"  (1.702 m)   Wt 81.6 kg (180 lb)   SpO2 99%   BMI 28.19 kg/m  or  Vitals:   10/23/15 1558 10/23/15 1602 10/23/15 1603 10/23/15 1634  BP:   (!) 155/78 136/79  Pulse:   71 74  Resp:  16  16  Temp: 99.4 F (37.4 C)     TempSrc: Axillary     SpO2:      Weight:      Height:         Dilation: 10 Dilation Complete Date: 10/23/15 Dilation Complete Time: 1401 Effacement (%): 100 Station: +1 Presentation: Vertex Exam by:: s grindstaff rn FHT: 160 baseline, minimal variability, no accelerations, some variable decelerations Uterine activity: q2-803min  Labs: Lab Results  Component Value Date   WBC 13.5 (H) 10/23/2015   HGB 11.9 (L) 10/23/2015   HCT 34.4 (L) 10/23/2015   MCV 84.5 10/23/2015   PLT 152 10/23/2015    Patient Active Problem List   Diagnosis Date Noted  . Gestational hypertension 10/22/2015  . Gestational hypertension w/o significant proteinuria in 3rd trimester 10/12/2015  . Smoker 10/10/2015  . Anxiety 10/10/2015  . Marijuana use 07/19/2015  . Supervision of normal pregnancy 04/25/2015    Assessment / Plan: 30 y.o. G2P0010 at 5222w5d here for IOL for gHTN  Labor: pitocin Fetal Wellbeing:  Category II Pain Control:  epidural Anticipated MOD:  SVD  Leland HerElsia J Channon Ambrosini, DO PGY-1 9/18/20174:53 PM

## 2015-10-23 NOTE — Progress Notes (Signed)
Called to evaluate pt. Nursing has been pushing with pt for approximately 2 hours. I have pushed with pt for approximately 20 minutes with no progress. Pt is not pushing very effectively. Appears tired and uncomfortable.   T: 100.75F Exam: 10/100/+1 FHTs: 165/mod var   Plan: pt tired and sore at this time. Recommend rest. Anesthesia MD to rebolus pt.  Pt is now febrile with tachycardic fetus. Dx: Tripple I. Start Amp/Gent and give 1g tylenol. Will monitor and plan to push with pt in 1 hour.

## 2015-10-23 NOTE — Progress Notes (Signed)
Pharmacy Antibiotic Note  Baird Cancerlizabeth Devins is a 30 y.o. female admitted on 10/22/2015 for IOL due to elevated BP.  Pharmacy has been consulted for Gentamicin  Dosing due to elevated maternal temp during labor  Plan: Gentamicin 180mg  IV q8h.  Height: 5\' 7"  (170.2 cm) Weight: 180 lb (81.6 kg) IBW/kg (Calculated) : 61.6  Dosing weight: 67.6kg   Temp (24hrs), Avg:98.8 F (37.1 C), Min:97.9 F (36.6 C), Max:100.5 F (38.1 C)   Recent Labs Lab 10/22/15 1306 10/23/15 0423  WBC 9.4 13.5*  CREATININE 0.48  --     Estimated Creatinine Clearance: 113 mL/min (by C-G formula based on SCr of 0.48 mg/dL).    Allergies  Allergen Reactions  . Shellfish Allergy Anaphylaxis and Hives  . Other Nausea And Vomiting    Red meat causes abdominal pain    Antimicrobials this admission: Ampicillin 2 gram IV q6h  9/18 >>      Microbiology results:   Thank you for allowing pharmacy to be a part of this patient's care.  Claybon Jabsngel, Bilbo Carcamo G 10/23/2015 5:47 PM

## 2015-10-23 NOTE — Anesthesia Preprocedure Evaluation (Signed)
Anesthesia Evaluation  Patient identified by MRN, date of birth, ID band Patient awake    Reviewed: Allergy & Precautions, NPO status , Patient's Chart, lab work & pertinent test results  Airway Mallampati: II  TM Distance: >3 FB Neck ROM: Full    Dental no notable dental hx.    Pulmonary Current Smoker,    Pulmonary exam normal        Cardiovascular hypertension (gestational hypertension), negative cardio ROS Normal cardiovascular exam     Neuro/Psych PSYCHIATRIC DISORDERS Anxiety negative neurological ROS  negative psych ROS   GI/Hepatic negative GI ROS, Neg liver ROS,   Endo/Other  negative endocrine ROS  Renal/GU negative Renal ROS  negative genitourinary   Musculoskeletal negative musculoskeletal ROS (+)   Abdominal   Peds negative pediatric ROS (+)  Hematology negative hematology ROS (+)   Anesthesia Other Findings   Reproductive/Obstetrics (+) Pregnancy                             Anesthesia Physical Anesthesia Plan  ASA: II  Anesthesia Plan: Epidural   Post-op Pain Management:    Induction:   Airway Management Planned:   Additional Equipment:   Intra-op Plan:   Post-operative Plan:   Informed Consent:   Plan Discussed with:   Anesthesia Plan Comments:         Anesthesia Quick Evaluation

## 2015-10-23 NOTE — Progress Notes (Signed)
Pt not feeling any pressure.  Attempted to push with patient.  No movement of fetal head with pushing.

## 2015-10-24 NOTE — Lactation Note (Addendum)
This note was copied from a baby's chart. Lactation Consultation Note Had multiple calls on admission to the floor about Pt. Unable to tolerate BF or hand expression. Unable to tolerate any touching or pumping to breast and nipples. RN set up DEBP, pt didn't like it and unable to tolerate. RN hand expressed 3ml colostrum in spoon to give to baby who was cueing and fussy to eat. When LC entered rm. Mom resting on her side, baby sleeping. After introduction as LC mom slightly rolled her eyes and turned over for me to assess. Mom stated she was tired. I explained that I had gotten several calls to come see her but I was in rooms and came when I could. I can come back later. Mom stated "no". Asked mom what it felt like when she latched and what was going on when hand expressed. Mom stated she didn't like the way it felt when pumped. Wasn't going to do that now. Mom stated she has always had tender breast. Denies increase in breast during pregnancy, but had increase in areola. Dicussed hand expression, taught hand expression, mom tolerated well. Mom stated It didn't hurt her as bad. Collected 1ml colostrum, easily flowed. Has flat nipples, can't tolerate checking for compression in areola and nipples to Rt. Breast. Lt. Breast heavy, flat nipples unable to compress at all, mom couldn't tolerate hand expression to Lt. Breast.  Fitted mom to Rt. Breast #20 nipple shield to see if could tolerate BF. Baby fussy, STS in football position latched baby, mom holding breast in "C" position w/o pain. Mom stated it felt weird but tolerable. No pain! After baby finally started suckling, BF great w/good nutritive suckling. Mom didn't c/o at all during the feeding. Lt. Breast heavy, attempted to assess nipple size but mom couldn't tolerate. Nipple to Lt. Nipple appears by looking appears smaller may need #16 NS. LC left at bedside. Education the whole time baby was to breast. Mom got very sleepy. Explained hormones after pregnancy.    Educated about newborn behavior, STS, I&O, cluster feeding, supply and demand. Discussed baby weight 5.15 lbs, importance of feeding baby and keeping strict I&O. Mom encouraged to feed baby 8-12 times/24 hours and with feeding cues. Referred to Baby and Me Book in Breastfeeding section Pg. 22-23 for position options and Proper latch demonstration. WH/LC brochure given w/resources, support groups and LC services. Mom has WIC.  Patient Name: Shelley [name] [id number] Date: 10/24/2015 Reason for consult: Initial assessment    Maternal Data Has patient been taught Hand Expression?: Yes Does the patient have breastfeeding experience prior to this delivery?: No  Feeding Feeding Type: Breast Fed Length of feed: 20 min  LATCH Score/Interventions Latch: Grasps breast easily, tongue down, lips flanged, rhythmical sucking. Intervention(s): Skin to skin;Teach feeding cues;Waking techniques  Audible Swallowing: Spontaneous and intermittent Intervention(s): Skin to skin;Hand expression  Type of Nipple: Flat Intervention(s): Shells;Hand pump;Double electric pump  Comfort (Breast/Nipple): Engorged, cracked, bleeding, large blisters, severe discomfort (nipples intact, no trauma or redness. just has severly sensitive nipples)     Hold (Positioning): Full assist, staff holds infant at breast Intervention(s): Breastfeeding basics reviewed;Support Pillows;Position options;Skin to skin  LATCH Score: 5  Lactation Tools Discussed/Used Tools: Shells;Nipple Dorris Carnes;Pump Nipple shield size: 20 Shell Type: Inverted Breast pump type: Double-Electric Breast Pump WIC Program: Yes Pump Review: Setup, frequency, and cleaning;Milk Storage Initiated by:: RN/L. Cloie Wooden RN IBCLC Date initiated:: 10/24/15   Consult Status Consult Status: Follow-up Date: 10/24/15 Follow-up type: In-patient  Shelley [name] 10/24/2015, 2:25 AM

## 2015-10-24 NOTE — Anesthesia Postprocedure Evaluation (Signed)
Anesthesia Post Note  Patient: Shelley [name]  Procedure(s) Performed: * No procedures listed *  Patient location during evaluation: Mother Baby Anesthesia Type: Epidural Level of consciousness: awake Pain management: pain level controlled Vital Signs Assessment: post-procedure vital signs reviewed and stable Respiratory status: spontaneous breathing Cardiovascular status: stable Postop Assessment: no headache, no backache, epidural receding, patient able to bend at knees, no signs of nausea or vomiting and adequate PO intake Anesthetic complications: no     Last Vitals:  Vitals:   10/24/15 0020 10/24/15 0430  BP: 136/80 124/79  Pulse: 97 75  Resp: 18 18  Temp: 36.9 C 36.7 C    Last Pain:  Vitals:   10/24/15 0555  TempSrc:   PainSc: 3    Pain Goal: Patients Stated Pain Goal: 2 (10/24/15 0555)               Fanny DanceMULLINS,[name]

## 2015-10-24 NOTE — Progress Notes (Signed)
Post Partum Day 1 Subjective: no complaints, up ad lib, voiding and tolerating PO  Objective: Blood pressure 124/79, pulse 75, temperature 98 F (36.7 C), temperature source Oral, resp. rate 18, height 5\' 7"  (1.702 m), weight 81.6 kg (180 lb), SpO2 99 %, unknown if currently breastfeeding.  Physical Exam:  General: alert, cooperative and no distress Lochia: appropriate Uterine Fundus: firm DVT Evaluation: No evidence of DVT seen on physical exam. Negative Homan's sign.   Recent Labs  10/23/15 0423 10/23/15 2229  HGB 11.9* 11.6*  HCT 34.4* 33.9*    Assessment/Plan: Plan for discharge tomorrow   LOS: 2 days   Shelley [name] PGY-1 10/24/2015, 7:41 AM   OB FELLOW POSTPARTUM PROGRESS NOTE ATTESTATION  I have seen and examined this patient and agree with above documentation in the resident's note.   Shelley PennaNicholas Schenk, MD 8:06 AM

## 2015-10-24 NOTE — Lactation Note (Signed)
This note was copied from a baby's chart. Lactation Consultation Note  Patient Name: Shelley [name] Cancerlizabeth Wiens RUEAV'WToday's Date: 10/24/2015 Reason for consult: Follow-up assessment;Infant < 6lbs;Other (Comment) (early term 37.5 wks. ) Mom reports having difficulty with latch with or without nipple shield. Mom reports she has sensitive breasts and has discomfort with baby at breast and some discomfort with pumping.  She recently pumped and received 10 ml of colostrum which she had given baby 5 ml around 1350. Baby giving feeding ques and Mom agreed to try and latch baby. Baby very fussy and having difficulty organizing his suck, tongue thrusting. After several attempts and position change, pre-loading the nipple shield with colostrum,  baby did latch to left breast using 20 nipple shield. Baby demonstrated good suckling bursts. Mom reported some mild discomfort and LC noticed Mom shaking while baby at breast. LC discussed with Mom her feeding choices and she reported she had not planned to BF, she was planning to pump/bottle feed. LC asked Mom if she wanted to continue to offer breast and she reported her choice to be pump/bottle feed for now, she may try breast again when she gets home. LC advised Mom she needs to pump every 3 hours for 15 minutes to encourage milk production. Supplemental guidelines reviewed with and given to Mom if BF then supplementing or exclusively bottle feeding. Mom reports she does have DEBP at home. LC advised RN of Mom's decision. Encouraged Mom to advise RN for formula if not receiving enough breastmilk with pumping to meet guidelines per hours of age. If she has any desire to put baby to breast encouraged to keep working on this so baby will learn. Encouraged to call for questions/concerns.    Maternal Data    Feeding Feeding Type: Breast Milk Length of feed: 10 min (off/on)  LATCH Score/Interventions Latch: Repeated attempts needed to sustain latch, nipple held in mouth  throughout feeding, stimulation needed to elicit sucking reflex. (using #20 nipple shield) Intervention(s): Adjust position;Assist with latch;Breast massage;Breast compression  Audible Swallowing: A few with stimulation  Type of Nipple: Everted at rest and after stimulation (short nipple shafts bilateral) Intervention(s): Shells;Double electric pump  Comfort (Breast/Nipple): Filling, red/small blisters or bruises, mild/mod discomfort  Problem noted: Mild/Moderate discomfort  Hold (Positioning): Assistance needed to correctly position infant at breast and maintain latch. Intervention(s): Breastfeeding basics reviewed;Support Pillows;Position options;Skin to skin  LATCH Score: 6  Lactation Tools Discussed/Used Tools: Nipple Shields;Pump;Shells Nipple shield size: 20 Shell Type: Inverted Breast pump type: Double-Electric Breast Pump   Consult Status Consult Status: Follow-up Date: 10/25/15 Follow-up type: In-patient    Alfred LevinsGranger, Evadna Donaghy Ann 10/24/2015, 4:51 PM

## 2015-10-24 NOTE — Clinical Social Work Maternal (Signed)
CLINICAL SOCIAL WORK MATERNAL/CHILD NOTE  Patient Details  Name: Shelley [name] MRN: [id number] Date of Birth: [birth year]  Date:  10/24/2015  Clinical Social Worker Initiating Note:  Laurey Arrow Date/ Time Initiated:  10/24/15/1423     Child's Name:  Shelley [name]    Legal Guardian:  Mother   Need for Interpreter:  None   Date of Referral:  10/24/15     Reason for Referral:  Current Substance Use/Substance Use During Pregnancy    Referral Source:  [facility]   Address:  [place] [address]  Phone number:  [phone]   Household Members:  Self, Parents   Natural Supports (not living in the home):  Extended Family, Immediate Family, Friends, Spouse/significant other, Artist Supports: None   Employment: Part-time   Type of Work: Technical sales engineer:  Database administrator Resources:  Medicaid   Other Resources:  [facility]   Cultural/Religious Considerations Which May Impact Care:  None Reported  Strengths:  Ability to meet basic needs , Engineer, materials , Home prepared for child    Risk Factors/Current Problems:  Substance Use     Cognitive State:  Alert , Insightful    Mood/Affect:  Interested , Relaxed , Irritable , Agitated    CSW Assessment: CSW met with MOB to complete an assessment for hx of substance abuse in pregnancy and hx of anxiety and depression  MOB gave CSW permission to meet with MOB while FOB (Tommy Gatewood), and cousin Joelyn Oms) was present.  MOB appeared to be irritated as evidence by MOB's short responses to CSW and MOB's lack of eye contact.  CSW inquired about MOB's substance use and MOB reported utilizing marijuana during pregnancy to decrease MOB's nausea and to increase MOB's appetite.  MOB stated that MOB's doctor was aware of substance use and although medications were prescribed to MOB, the medications were not effective.  CSW informed MOB of the hospital's drug screen policy. CSW was  made aware of the 2 drug screenings for the infant.  MOB was understanding and did not have any concerns. CSW informed MOB that the infant's UDS was pending and CSW will follow-up with MOB when UDS results are determined.  MOB's cousin appeared agitated with CSW and communicated to CSW that MOB has not used and the infant's UDS should be negative.  CSW reiterated to the family that the results would be shared with MOB when the results become available. CSW attempted to educated MOB and FOB about SIDS.  CSW informed the family of safe sleep interventions. FOB asked an appropriate question regarding the infant's sleeping position.  CSW re-enforced to place the infant on his back to sleep.  MOB communicated that MOB does not have to place the infant on the back and MOB will discuss sleeping position with FOB at later time.  CSW encouraged the family again to place the infant on his back to sleep. CSW educated MOB and FOB about PPD. CSW informed MOB of possible supports and interventions to decrease PPD.  CSW also encouraged MOB to seek medical attention if needed for increased signs and symptoms of PPD.  CSW inquired about MOB's hx of anxiety and depression and MOB acknowledged both.  MOB communicated not taking an medications during pregnancy and agreed to follow-up with MOB's provider for medication management. CSW offered the family resources and referral for substance abuse counseling and parenting and MOB declined the information.  MOB reports that MOB will follow-up  with [facility] if services are needed.   CSW made a report to [place] with assessment worker, Sandy Salaam. CPS will follow up with MOB within 48 hours.  CSW Plan/Description:  Child Protective Service Report , No Further Intervention Required/No Barriers to Discharge, Patient/Family Education  (CSW made report to [facility]. )    Shelley Rotolo D BOYD-GILYARD, LCSW 10/24/2015, 2:26 PM

## 2015-10-25 MED ORDER — IBUPROFEN 600 MG PO TABS: 600.0000 mg | ORAL_TABLET | Freq: Four times a day (QID) | ORAL | 0 refills | Status: DC

## 2015-10-25 MED ORDER — SENNOSIDES-DOCUSATE SODIUM 8.6-50 MG PO TABS: 2.0000 | ORAL_TABLET | ORAL | 0 refills | Status: DC

## 2015-10-25 NOTE — Plan of Care (Signed)
Problem: Education: Goal: Knowledge of condition will improve Outcome: Progressing Explained parts of breast care for nursing mom this am, she is receptive and expresses understanding.

## 2015-10-25 NOTE — Progress Notes (Signed)
Pt verbalizes understanding of d/c instructions, medications, follow up appts, when to seek medical attention and belongings policy. Pt has no questions at this time. Pt is concerned about any costs of a Tdap vaccine. I explained that I was unsure if this was covered, but called [facility] and pharmacy, neither of whom knew anything about this. Pt declines and this time and states that she will follow up with her physician on this issue as an outpatient. Pts infant is not being d/c at this time, so pt will be remaining in the hospital to provide care for the infant. Sheryn BisonGordon, Aundra Pung Warner

## 2015-10-25 NOTE — Lactation Note (Signed)
This note was copied from a baby's chart. Lactation Consultation Note  Patient Name: Shelley [name] Cancerlizabeth Montalvo UVOZD'GToday's Date: 10/25/2015 Reason for consult: Follow-up assessment;Hyperbilirubinemia Mom is pump/bottle feeding, baby now on double photo therapy. Mom reports she may put baby to breast. LC advised Mom to continue to pump every 3 hours for 15 minutes to encourage milk production. Encouraged to supplement with minimum of 20 ml of EBM formula each feeding every 3 hours. When Mom ready to offer breast to call if she would like assist.   Maternal Data    Feeding    LATCH Score/Interventions                      Lactation Tools Discussed/Used Tools: Pump;Nipple Shields Nipple shield size: 20 Breast pump type: Double-Electric Breast Pump   Consult Status Consult Status: Follow-up Date: 10/26/15 Follow-up type: In-patient    Alfred LevinsGranger, Jerrik Housholder Ann 10/25/2015, 12:51 PM

## 2015-10-25 NOTE — Discharge Instructions (Signed)

## 2015-10-25 NOTE — Discharge Summary (Signed)
OB Discharge Summary     Patient Name: Shelley [name] DOB: 29-May-1985 MRN: [id number]  Date of admission: 10/22/2015 Delivering MD: Frederik PearEGELE, JULIE P   Date of discharge: 10/25/2015  Admitting diagnosis: 37.3 WKS, HIGH BP, LOW FM Intrauterine pregnancy: 3059w5d     Secondary diagnosis:  Active Problems:   Gestational hypertension   SVD (spontaneous vaginal delivery)  Additional problems: gHTN, triple I     Discharge diagnosis: Term Pregnancy Delivered                                                                                                Post partum procedures:none  Augmentation: AROM, Pitocin and Cytotec  Complications: Intrauterine Inflammation or infection (Chorioamniotis)  Hospital course:  Induction of Labor With Vaginal Delivery   30 y.o. yo G2P1011 at 3559w5d was admitted to the hospital 10/22/2015 for induction of labor.  Indication for induction: Gestational hypertension.  Patient had an uncomplicated labor course as follows: Membrane Rupture Time/Date: 11:43 AM ,10/23/2015   Intrapartum Procedures: Episiotomy: None [1]                                         Lacerations:  2nd degree [3]  Patient had delivery of a Viable infant.  Information for the patient's newborn:  Phillis KnackYeaman, Boy Naina [[id number]][[id number]]      10/23/2015  Details of delivery can be found in separate delivery note.  Patient had a routine postpartum course. Patient is discharged home 10/25/15.   Physical exam Vitals:   10/24/15 1230 10/24/15 2126 10/24/15 2308 10/25/15 0534  BP: (!) 133/91 (!) 141/87 (!) 139/94 129/83  Pulse: 78 74 85 74  Resp: 16 18  18   Temp: 98.3 F (36.8 C) 98.5 F (36.9 C)  98 F (36.7 C)  TempSrc: Oral Oral  Oral  SpO2:      Weight:      Height:       General: alert, cooperative and no distress Lochia: appropriate Uterine Fundus: firm DVT Evaluation: No evidence of DVT seen on physical exam. Negative Homan's sign. Labs: Lab Results  Component Value Date   WBC  31.9 (H) 10/23/2015   HGB 11.6 (L) 10/23/2015   HCT 33.9 (L) 10/23/2015   MCV 85.2 10/23/2015   PLT 143 (L) 10/23/2015   CMP Latest Ref Rng & Units 10/22/2015  Glucose 65 - 99 mg/dL 95  BUN 6 - 20 mg/dL 9  Creatinine 3.660.44 - 4.401.00 mg/dL 3.470.48  Sodium 425135 - 956145 mmol/L 134(L)  Potassium 3.5 - 5.1 mmol/L 3.7  Chloride 101 - 111 mmol/L 108  CO2 22 - 32 mmol/L 21(L)  Calcium 8.9 - 10.3 mg/dL 3.8(V8.7(L)  Total Protein 6.5 - 8.1 g/dL 6.4(L)  Total Bilirubin 0.3 - 1.2 mg/dL 0.6  Alkaline Phos 38 - 126 U/L 128(H)  AST 15 - 41 U/L 18  ALT 14 - 54 U/L 15    Discharge instruction: per After Visit Summary and "Baby and Me Booklet".  After visit meds:  Medication List    STOP taking these medications   metoCLOPramide 10 MG tablet Commonly known as:  REGLAN     TAKE these medications   ibuprofen 600 MG tablet Commonly known as:  ADVIL,MOTRIN Take 1 tablet (600 mg total) by mouth every 6 (six) hours.   omeprazole 20 MG capsule Commonly known as:  PRILOSEC Take 1 capsule (20 mg total) by mouth daily.   PROVIDA DHA 16-16-1.25-110 MG Caps Take 1 tablet by mouth daily.   senna-docusate 8.6-50 MG tablet Commonly known as:  Senokot-S Take 2 tablets by mouth daily. Start taking on:  10/26/2015   VIACTIV PO Take 1 tablet by mouth daily.       Diet: routine diet  Activity: Advance as tolerated. Pelvic rest for 6 weeks.   Outpatient follow up:1 week for BP check, 6 weeks for postpartum visit Follow up Appt:Future Appointments Date Time Provider Department Center  12/05/2015 9:00 AM Cheral Marker, CNM [facility]   Follow up Visit: Follow-up Information    [facility]. Schedule an appointment as soon as possible for a visit in 1 week(s).   Specialty:  Obstetrics and Gynecology Why:  For BP check in 1 week and then for 6 week postpartum visit Contact information: [address] [place] [address] [phone]         Postpartum  contraception: Nexplanon  Newborn Data: Live born female  Birth Weight: 5 lb 15 oz (2693 g) APGAR: 6, 8  Baby Feeding: Bottle and Breast Disposition:home with mother   10/25/2015 Leland Her, DO PGY-1

## 2015-11-02 ENCOUNTER — Encounter: Payer: Self-pay | Admitting: Advanced Practice Midwife

## 2015-11-02 ENCOUNTER — Ambulatory Visit (INDEPENDENT_AMBULATORY_CARE_PROVIDER_SITE_OTHER): Payer: Medicaid Other | Admitting: Advanced Practice Midwife

## 2015-11-02 ENCOUNTER — Ambulatory Visit (HOSPITAL_COMMUNITY)
Admission: RE | Admit: 2015-11-02 | Discharge: 2015-11-02 | Disposition: A | Discharge status: Home / Self Care | Payer: Medicaid Other | Source: Ambulatory Visit | Attending: Advanced Practice Midwife | Admitting: Advanced Practice Midwife

## 2015-11-02 VITALS — BP 124/88 | HR 106 | Wt 151.0 lb

## 2015-11-02 DIAGNOSIS — R0602 Shortness of breath: Secondary | ICD-10-CM

## 2015-11-02 MED ORDER — IOPAMIDOL (ISOVUE-370) INJECTION 76%
100.0000 mL | Freq: Once | INTRAVENOUS | Status: AC | PRN
  Administered 2015-11-02: 100 mL via INTRAVENOUS

## 2015-11-02 NOTE — Progress Notes (Signed)
[facility] Clinic Visit  Patient name: Shelley [name] MRN [id number]  Date of birth: 04/15/1985  CC & HPI:  Shelley [name] is a 30 y.o. Caucasian female presenting today for BP checkup .She had a SVD on 9/11, IOL for preeclampsia.  Was  Not DC'd on any meds.  Has c/o SOB upon exertion for nearly a week, but lately has had SOB even at rest.  Has also had an onff and on HA  Denies blurred vision, RUQ pain, chest pain.   Pertinent History Reviewed:  Medical & Surgical Hx:   Past Medical History:  Diagnosis Date  . Anxiety   . Depression   . Mental disorder    depression and anxiety    Past Surgical History:  Procedure Laterality Date  . DILATION AND CURETTAGE OF UTERUS    . HAND SURGERY     Left pinky finger   History reviewed. No pertinent family history.  Current Outpatient Prescriptions:  .  Calcium-Vitamin D-Vitamin K (VIACTIV PO), Take 1 tablet by mouth daily. , Disp: , Rfl:  .  ibuprofen (ADVIL,MOTRIN) 600 MG tablet, Take 1 tablet (600 mg total) by mouth every 6 (six) hours., Disp: 30 tablet, Rfl: 0 .  senna-docusate (SENOKOT-S) 8.6-50 MG tablet, Take 2 tablets by mouth daily., Disp: 30 tablet, Rfl: 0 .  omeprazole (PRILOSEC) 20 MG capsule, Take 1 capsule (20 mg total) by mouth daily. (Patient not taking: Reported on 11/02/2015), Disp: 30 capsule, Rfl: 1 .  Prenat-FeFum-FePo-FA-DHA w/o A (PROVIDA DHA) 16-16-1.25-110 MG CAPS, Take 1 tablet by mouth daily. (Patient not taking: Reported on 11/02/2015), Disp: 30 capsule, Rfl: 11 Social History: Reviewed -  reports that she has been smoking Cigarettes.  She has been smoking about 0.25 packs per day. She has never used smokeless tobacco.  Review of Systems:   Constitutional: Negative for fever and chills Eyes: Negative for visual disturbances Respiratory: SOB, no 02 sat in office Cardiovascular: Negative for chest pain or palpitations  Gastrointestinal: Negative for vomiting, diarrhea and constipation; no abdominal  pain Genitourinary: Negative for dysuria and urgency, vaginal irritation or itching Musculoskeletal: Negative for back pain, joint pain, myalgias  Neurological: Negative for dizziness    Objective Findings:    Physical Examination: General appearance - well appearing, and in no distress Mental status - alert, oriented to person, place, and time Chest:  Normal respiratory effort; LCTAB.   Heart - normal rate and regular rhythm, HR 110 Abdomen:  Soft, nontender Musculoskeletal:  Normal range of motion without pain Extremities:  No edema    No results found for this or any previous visit (from the past 24 hour(s)).    Assessment & Plan:  A:   1 week PP, SOB  BP fine  HA responded nicely to accupressure on occipital bones P:  Discussed w/ Dr. Macon LargeAnyanwu.  To [facility] for stat PE CT.  Consider echo d/t preelampsia   Return for order and schedule nexplanon                                                                        .  CRESENZO-DISHMAN,Pauletta Pickney CNM 11/02/2015 2:42 PM

## 2015-12-05 ENCOUNTER — Ambulatory Visit: Payer: Medicaid Other | Admitting: Women's Health

## 2015-12-14 ENCOUNTER — Ambulatory Visit: Payer: Medicaid Other | Admitting: Women's Health

## 2015-12-19 ENCOUNTER — Ambulatory Visit (INDEPENDENT_AMBULATORY_CARE_PROVIDER_SITE_OTHER): Payer: Medicaid Other | Admitting: Advanced Practice Midwife

## 2015-12-19 ENCOUNTER — Encounter: Payer: Self-pay | Admitting: Advanced Practice Midwife

## 2015-12-19 MED ORDER — HYDROCORTISONE ACE-PRAMOXINE 1-1 % RE CREA: 1.0000 "application " | TOPICAL_CREAM | Freq: Two times a day (BID) | RECTAL | 2 refills | Status: DC

## 2015-12-19 NOTE — Progress Notes (Signed)
Shelley [name] is a 30 y.o. who presents for a postpartum visit. She is 8 weeks postpartum following a spontaneous vaginal delivery. I have fully reviewed the prenatal and intrapartum course. The delivery was at 37.5 gestational weeks.  Anesthesia: epidural. Postpartum course has been uneventful. Baby's course has been uneventful. Baby is feeding by breast and bottle, weaning . Bleeding: had a light period a few days ago. Bowel function is normal. Bladder function is normal. Patient is sexually active. Contraception method is none. Postpartum depression screening: negative.   Current Outpatient Prescriptions:  .  Calcium-Vitamin D-Vitamin K (VIACTIV PO), Take 1 tablet by mouth daily. , Disp: , Rfl:  .  senna-docusate (SENOKOT-S) 8.6-50 MG tablet, Take 2 tablets by mouth daily., Disp: 30 tablet, Rfl: 0 .  ibuprofen (ADVIL,MOTRIN) 600 MG tablet, Take 1 tablet (600 mg total) by mouth every 6 (six) hours. (Patient not taking: Reported on 12/19/2015), Disp: 30 tablet, Rfl: 0 .  omeprazole (PRILOSEC) 20 MG capsule, Take 1 capsule (20 mg total) by mouth daily. (Patient not taking: Reported on 12/19/2015), Disp: 30 capsule, Rfl: 1 .  Prenat-FeFum-FePo-FA-DHA w/o A (PROVIDA DHA) 16-16-1.25-110 MG CAPS, Take 1 tablet by mouth daily. (Patient not taking: Reported on 12/19/2015), Disp: 30 capsule, Rfl: 11  Review of Systems   Constitutional: Negative for fever and chills Eyes: Negative for visual disturbances Respiratory: Negative for shortness of breath, dyspnea Cardiovascular: Negative for chest pain or palpitations  Gastrointestinal: Negative for vomiting, diarrhea and constipation Genitourinary: Negative for dysuria and urgency Musculoskeletal: Negative for back pain, joint pain, myalgias  Neurological: Negative for dizziness and headaches    Objective:     Vitals:   12/19/15 1206  BP: 108/78  Pulse: 82   General:  alert, cooperative and no distress   Breasts:  negative  Lungs: clear to  auscultation bilaterally  Heart:  regular rate and rhythm  Abdomen: Soft, nontender   Vulva:  normal  Vagina: normal vagina  Cervix:  closed  Corpus: Well involuted     Rectal Exam: no hemorrhoids        Assessment:    normal postpartum exam.  Plan:   1. Contraception: IUD 2. Follow up in:  asap for Mirena or as needed.

## 2016-01-23 ENCOUNTER — Ambulatory Visit: Payer: Medicaid Other | Admitting: Advanced Practice Midwife

## 2016-01-25 ENCOUNTER — Ambulatory Visit: Payer: Medicaid Other | Admitting: Advanced Practice Midwife

## 2016-02-02 ENCOUNTER — Ambulatory Visit: Payer: Medicaid Other | Admitting: Adult Health

## 2016-02-15 ENCOUNTER — Ambulatory Visit: Payer: Self-pay | Admitting: Adult Health

## 2017-07-31 IMAGING — CT CT ANGIO CHEST
2 of 6 series · 19 of 46 positions shown · IV contrast (APPLIED)
Comparison: None.

CLINICAL DATA: Short of breath for 8 days

EXAM:
CT ANGIOGRAPHY CHEST WITH CONTRAST
TECHNIQUE: Multidetector CT imaging of the chest was performed using the
standard protocol during bolus administration of intravenous
contrast. Multiplanar CT image reconstructions and MIPs were
obtained to evaluate the vascular anatomy.
CONTRAST:  100 cc Isovue 370

[Series 5: thins · axial · 0.63mm/px · z∈[+965,+1233]mm · 16 of 294 slices shown]
[im 13/294  lung]
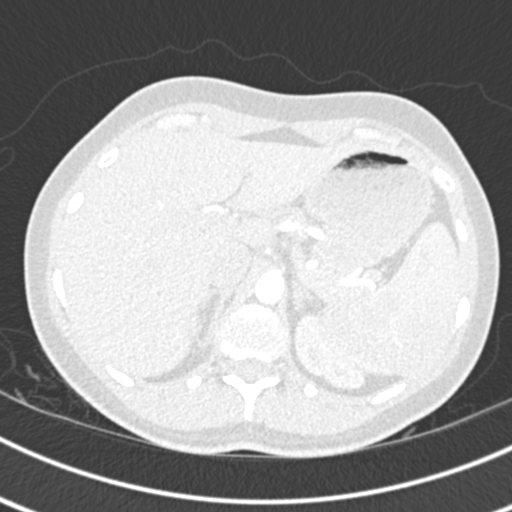
[im 39/294  soft-tissue]
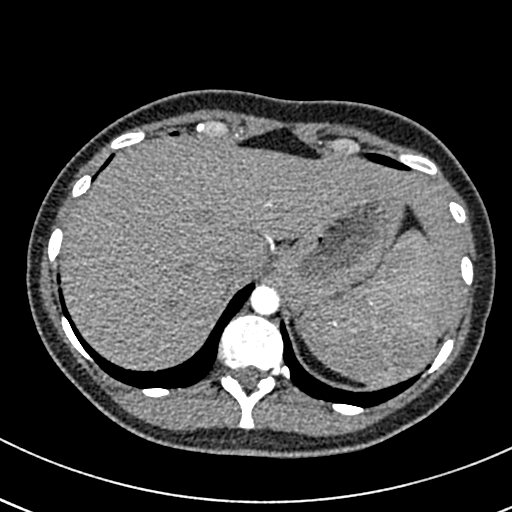
[im 51/294  lung]
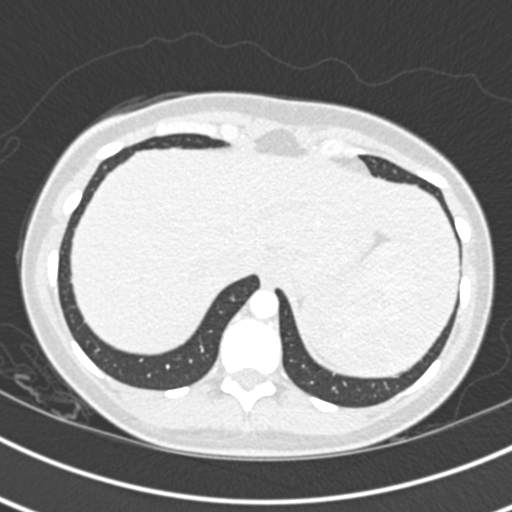
[im 64/294  soft-tissue]
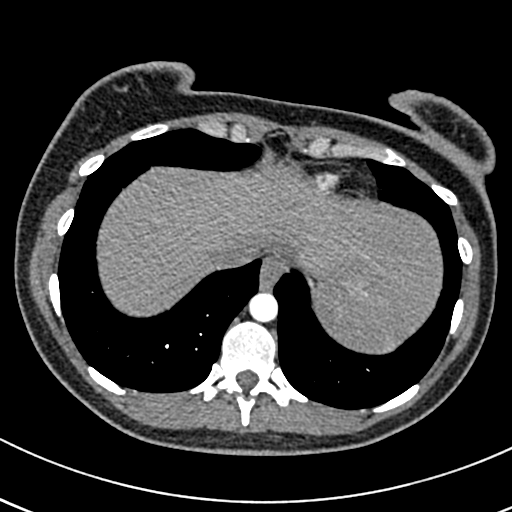
[im 90/294  lung]
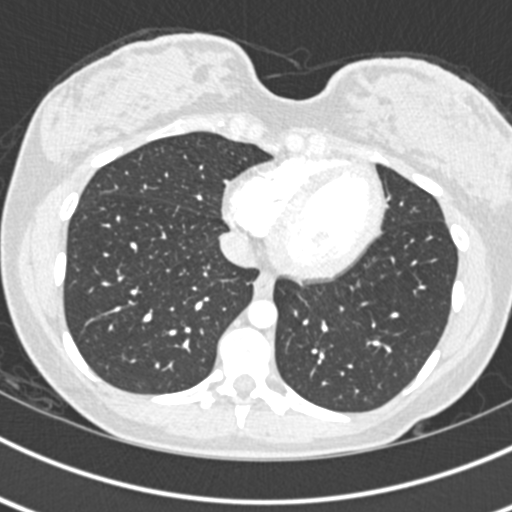
[im 102/294  soft-tissue]
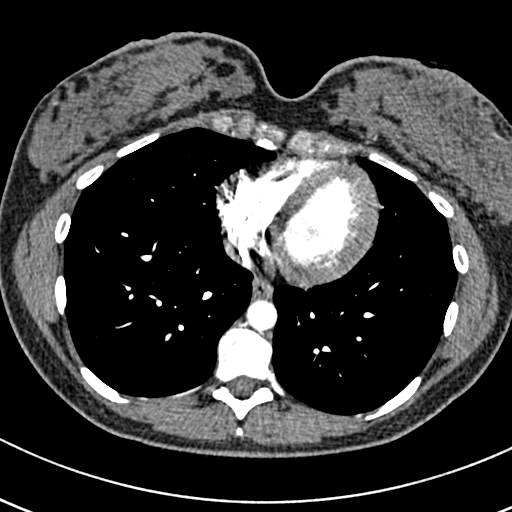
[im 115/294  lung]
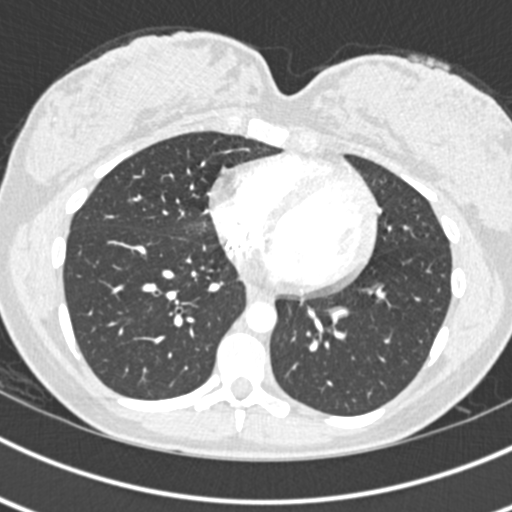
[im 141/294  soft-tissue]
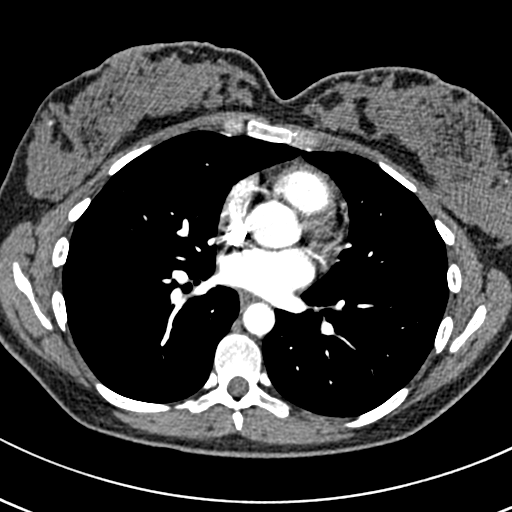
[im 153/294  lung]
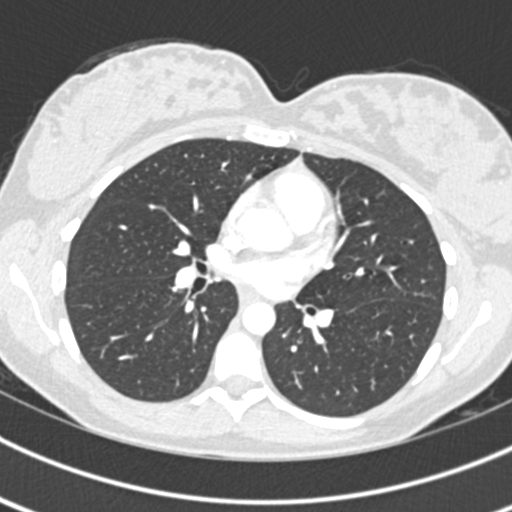
[im 179/294  soft-tissue]
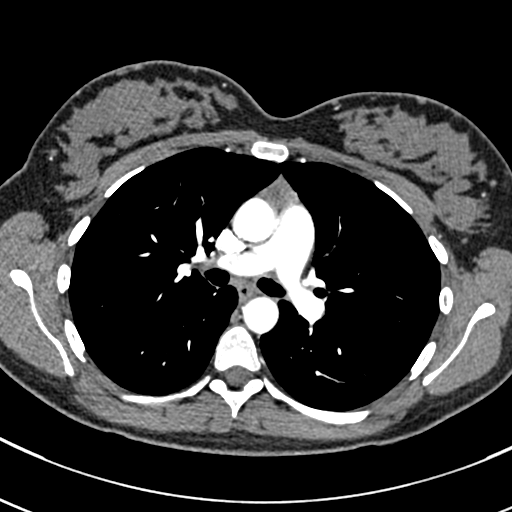
[im 192/294  lung]
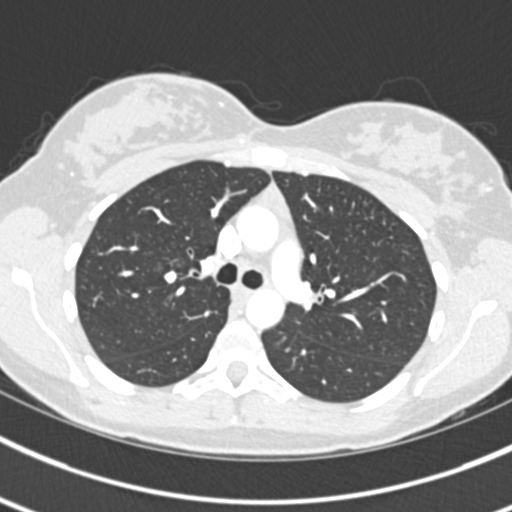
[im 204/294  soft-tissue]
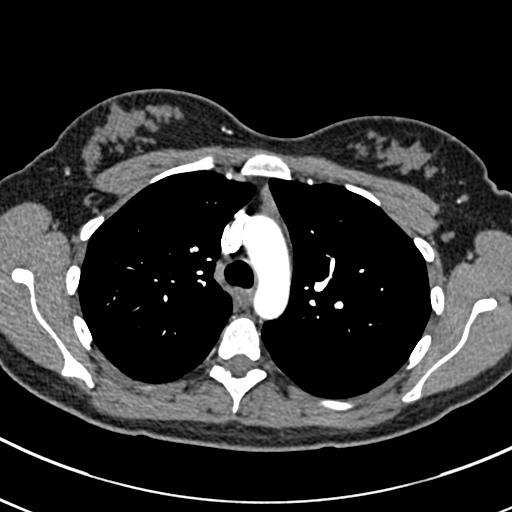
[im 230/294  lung]
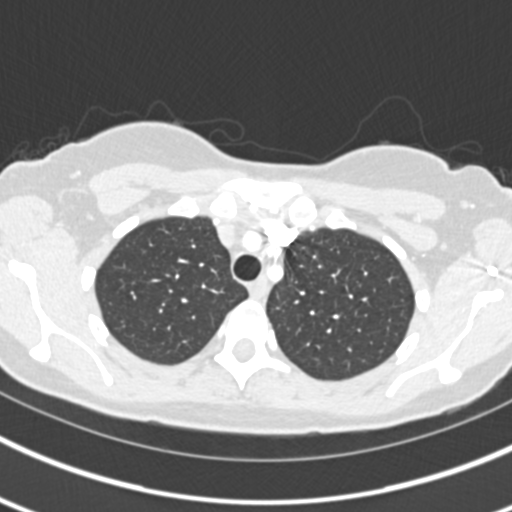
[im 243/294  soft-tissue]
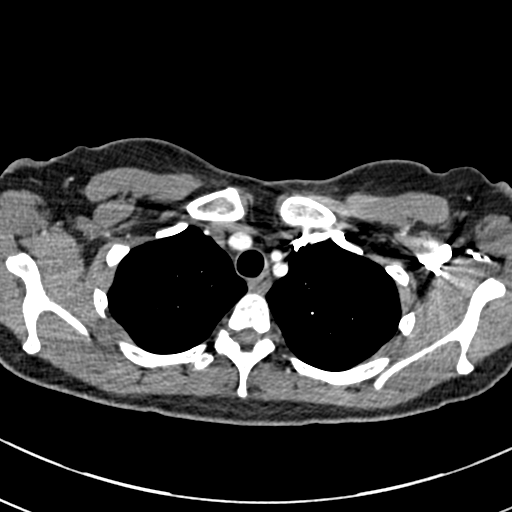
[im 255/294  lung]
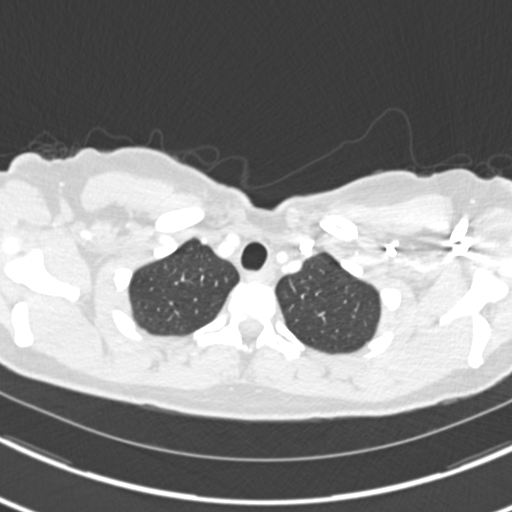
[im 281/294  soft-tissue]
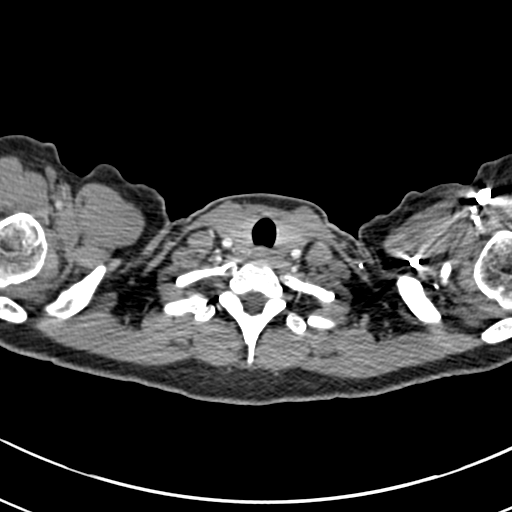

[Series 7: coronal mpr · coronal · 0.59mm/px · 3 of 107 slices shown]
[im 27/107  soft-tissue]
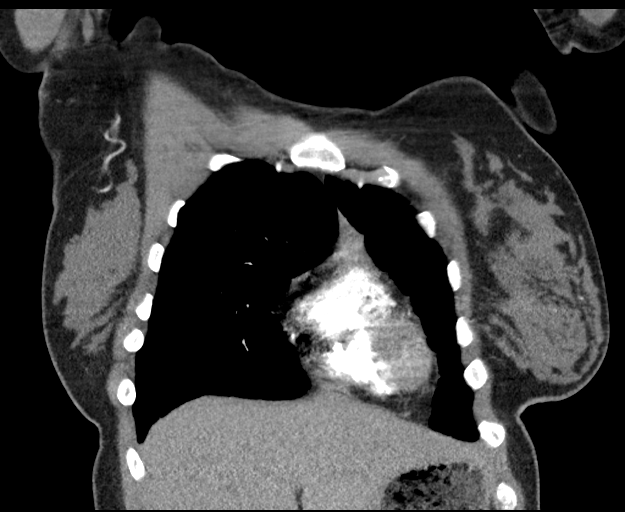
[im 54/107  soft-tissue]
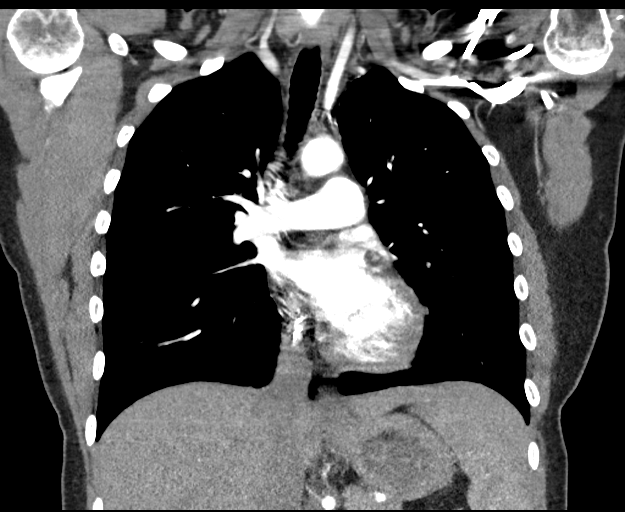
[im 80/107  soft-tissue]
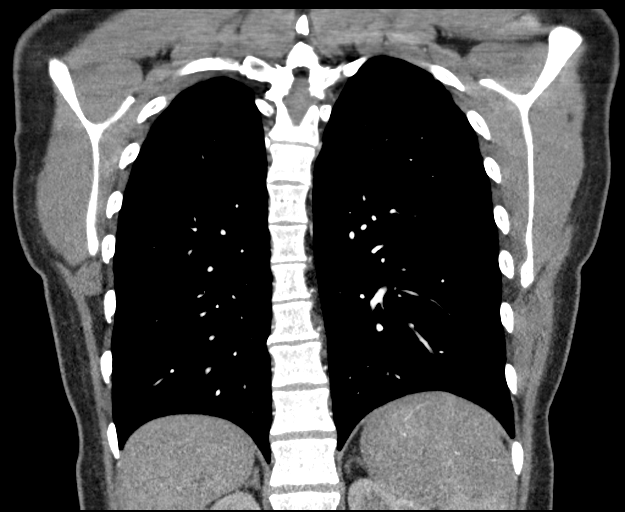

[19 of 46 positions shown; findings below may reference images not displayed]

FINDINGS: Cardiovascular:  No evidence of pulmonary embolism.

Mediastinum/Nodes: No abnormal adenopathy. Residual thymus is noted.

Lungs/Pleura: Minimal patchy ground-glass in the right upper lobe on
images 45 and 46.

Upper Abdomen: Unremarkable.

Musculoskeletal: No acute bony deformity.

Review of the MIP images confirms the above findings.
IMPRESSION: No evidence of pulmonary embolism.

## 2021-08-04 ENCOUNTER — Inpatient Hospital Stay (HOSPITAL_COMMUNITY)
Admission: EM | Admit: 2021-08-04 | Discharge: 2021-08-28 | DRG: 956 | Disposition: A | Discharge status: Rehabilitation Facility | Payer: Medicaid Other | Attending: General Surgery | Admitting: General Surgery

## 2021-08-04 ENCOUNTER — Encounter (HOSPITAL_COMMUNITY): Payer: Self-pay

## 2021-08-04 ENCOUNTER — Emergency Department (HOSPITAL_COMMUNITY): Payer: Medicaid Other

## 2021-08-04 DIAGNOSIS — R0989 Other specified symptoms and signs involving the circulatory and respiratory systems: Secondary | ICD-10-CM | POA: Diagnosis not present

## 2021-08-04 DIAGNOSIS — Z91013 Allergy to seafood: Secondary | ICD-10-CM

## 2021-08-04 DIAGNOSIS — F430 Acute stress reaction: Secondary | ICD-10-CM | POA: Diagnosis present

## 2021-08-04 DIAGNOSIS — S92251A Displaced fracture of navicular [scaphoid] of right foot, initial encounter for closed fracture: Secondary | ICD-10-CM | POA: Diagnosis present

## 2021-08-04 DIAGNOSIS — S76122A Laceration of left quadriceps muscle, fascia and tendon, initial encounter: Secondary | ICD-10-CM | POA: Diagnosis present

## 2021-08-04 DIAGNOSIS — S36039A Unspecified laceration of spleen, initial encounter: Secondary | ICD-10-CM

## 2021-08-04 DIAGNOSIS — R059 Cough, unspecified: Secondary | ICD-10-CM | POA: Diagnosis not present

## 2021-08-04 DIAGNOSIS — S298XXA Other specified injuries of thorax, initial encounter: Secondary | ICD-10-CM

## 2021-08-04 DIAGNOSIS — D62 Acute posthemorrhagic anemia: Secondary | ICD-10-CM | POA: Diagnosis not present

## 2021-08-04 DIAGNOSIS — S92002B Unspecified fracture of left calcaneus, initial encounter for open fracture: Secondary | ICD-10-CM | POA: Diagnosis present

## 2021-08-04 DIAGNOSIS — T1490XA Injury, unspecified, initial encounter: Secondary | ICD-10-CM | POA: Diagnosis present

## 2021-08-04 DIAGNOSIS — S329XXA Fracture of unspecified parts of lumbosacral spine and pelvis, initial encounter for closed fracture: Secondary | ICD-10-CM

## 2021-08-04 DIAGNOSIS — S9305XA Dislocation of left ankle joint, initial encounter: Secondary | ICD-10-CM | POA: Diagnosis present

## 2021-08-04 DIAGNOSIS — S37012A Minor contusion of left kidney, initial encounter: Secondary | ICD-10-CM | POA: Diagnosis present

## 2021-08-04 DIAGNOSIS — S72352A Displaced comminuted fracture of shaft of left femur, initial encounter for closed fracture: Secondary | ICD-10-CM

## 2021-08-04 DIAGNOSIS — S32059A Unspecified fracture of fifth lumbar vertebra, initial encounter for closed fracture: Secondary | ICD-10-CM | POA: Diagnosis present

## 2021-08-04 DIAGNOSIS — F17210 Nicotine dependence, cigarettes, uncomplicated: Secondary | ICD-10-CM | POA: Diagnosis present

## 2021-08-04 DIAGNOSIS — S72351A Displaced comminuted fracture of shaft of right femur, initial encounter for closed fracture: Secondary | ICD-10-CM

## 2021-08-04 DIAGNOSIS — Z20822 Contact with and (suspected) exposure to covid-19: Secondary | ICD-10-CM | POA: Diagnosis present

## 2021-08-04 DIAGNOSIS — S3792XA Contusion of unspecified urinary and pelvic organ, initial encounter: Secondary | ICD-10-CM | POA: Diagnosis present

## 2021-08-04 DIAGNOSIS — Y92411 Interstate highway as the place of occurrence of the external cause: Secondary | ICD-10-CM

## 2021-08-04 DIAGNOSIS — Z79899 Other long term (current) drug therapy: Secondary | ICD-10-CM

## 2021-08-04 DIAGNOSIS — N179 Acute kidney failure, unspecified: Secondary | ICD-10-CM | POA: Diagnosis present

## 2021-08-04 DIAGNOSIS — Z23 Encounter for immunization: Secondary | ICD-10-CM

## 2021-08-04 DIAGNOSIS — K219 Gastro-esophageal reflux disease without esophagitis: Secondary | ICD-10-CM | POA: Diagnosis present

## 2021-08-04 DIAGNOSIS — S32119A Unspecified Zone I fracture of sacrum, initial encounter for closed fracture: Principal | ICD-10-CM | POA: Diagnosis present

## 2021-08-04 DIAGNOSIS — S82401A Unspecified fracture of shaft of right fibula, initial encounter for closed fracture: Secondary | ICD-10-CM | POA: Diagnosis present

## 2021-08-04 DIAGNOSIS — S92112A Displaced fracture of neck of left talus, initial encounter for closed fracture: Secondary | ICD-10-CM

## 2021-08-04 DIAGNOSIS — K567 Ileus, unspecified: Secondary | ICD-10-CM | POA: Diagnosis not present

## 2021-08-04 DIAGNOSIS — Z91011 Allergy to milk products: Secondary | ICD-10-CM

## 2021-08-04 DIAGNOSIS — I96 Gangrene, not elsewhere classified: Secondary | ICD-10-CM | POA: Diagnosis not present

## 2021-08-04 DIAGNOSIS — S2242XA Multiple fractures of ribs, left side, initial encounter for closed fracture: Secondary | ICD-10-CM | POA: Diagnosis present

## 2021-08-04 DIAGNOSIS — S82831A Other fracture of upper and lower end of right fibula, initial encounter for closed fracture: Secondary | ICD-10-CM | POA: Diagnosis present

## 2021-08-04 DIAGNOSIS — S37019A Minor contusion of unspecified kidney, initial encounter: Secondary | ICD-10-CM

## 2021-08-04 DIAGNOSIS — S3991XA Unspecified injury of abdomen, initial encounter: Secondary | ICD-10-CM

## 2021-08-04 DIAGNOSIS — S2249XA Multiple fractures of ribs, unspecified side, initial encounter for closed fracture: Secondary | ICD-10-CM

## 2021-08-04 DIAGNOSIS — S92011B Displaced fracture of body of right calcaneus, initial encounter for open fracture: Secondary | ICD-10-CM

## 2021-08-04 DIAGNOSIS — S92111B Displaced fracture of neck of right talus, initial encounter for open fracture: Secondary | ICD-10-CM

## 2021-08-04 DIAGNOSIS — S82891C Other fracture of right lower leg, initial encounter for open fracture type IIIA, IIIB, or IIIC: Secondary | ICD-10-CM

## 2021-08-04 DIAGNOSIS — Z91014 Allergy to mammalian meats: Secondary | ICD-10-CM

## 2021-08-04 LAB — I-STAT BETA HCG BLOOD, ED (MC, WL, AP ONLY): I-stat hCG, quantitative: <5 m[IU]/mL (ref <5)

## 2021-08-04 LAB — CBC
HCT: 38.4 % (ref 36.0–46.0)
Hemoglobin: 13.4 g/dL (ref 12.0–15.0)
MCH: 32.2 pg (ref 26.0–34.0)
MCHC: 34.9 g/dL (ref 30.0–36.0)
MCV: 92.3 fL (ref 80.0–100.0)
Platelets: 300 10*3/uL (ref 150–400)
RBC: 4.16 MIL/uL (ref 3.87–5.11)
RDW: 11.9 % (ref 11.5–15.5)
WBC: 30.8 10*3/uL — ABNORMAL HIGH (ref 4.0–10.5)
nRBC: 0 % (ref 0.0–0.2)

## 2021-08-04 LAB — I-STAT CHEM 8, ED
BUN: 21 mg/dL — ABNORMAL HIGH (ref 6–20)
Calcium, Ion: 1 mmol/L — ABNORMAL LOW (ref 1.15–1.40)
Chloride: 106 mmol/L (ref 98–111)
Creatinine, Ser: 1.7 mg/dL — ABNORMAL HIGH (ref 0.44–1.00)
Glucose, Bld: 229 mg/dL — ABNORMAL HIGH (ref 70–99)
HCT: 38 % (ref 36.0–46.0)
Hemoglobin: 12.9 g/dL (ref 12.0–15.0)
Potassium: 3.7 mmol/L (ref 3.5–5.1)
Sodium: 137 mmol/L (ref 135–145)
TCO2: 19 mmol/L — ABNORMAL LOW (ref 22–32)

## 2021-08-04 LAB — PROTIME-INR
INR: 1.1 (ref 0.8–1.2)
Prothrombin Time: 13.8 seconds (ref 11.4–15.2)

## 2021-08-04 MED ORDER — TETANUS-DIPHTH-ACELL PERTUSSIS 5-2.5-18.5 LF-MCG/0.5 IM SUSY
0.5000 mL | PREFILLED_SYRINGE | Freq: Once | INTRAMUSCULAR | Status: AC
  Administered 2021-08-05: 0.5 mL via INTRAMUSCULAR
  Filled 2021-08-04: qty 0.5

## 2021-08-04 MED ORDER — SODIUM CHLORIDE 0.9 % IV SOLN
INTRAVENOUS | Status: AC | PRN
  Administered 2021-08-04: 1000 mL via INTRAVENOUS
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
## 2021-08-04 NOTE — ED Notes (Signed)
Pt pelvis is wide on xray, pt being placed in pelvic binder

## 2021-08-05 ENCOUNTER — Other Ambulatory Visit: Payer: Self-pay

## 2021-08-05 ENCOUNTER — Inpatient Hospital Stay (HOSPITAL_COMMUNITY): Payer: Medicaid Other | Admitting: Registered Nurse

## 2021-08-05 ENCOUNTER — Emergency Department (HOSPITAL_COMMUNITY): Payer: Medicaid Other

## 2021-08-05 ENCOUNTER — Other Ambulatory Visit (HOSPITAL_COMMUNITY): Payer: Self-pay

## 2021-08-05 ENCOUNTER — Inpatient Hospital Stay (HOSPITAL_COMMUNITY): Payer: Medicaid Other

## 2021-08-05 ENCOUNTER — Encounter (HOSPITAL_COMMUNITY): Admission: EM | Disposition: A | Discharge status: Rehabilitation Facility | Payer: Self-pay | Source: Home / Self Care

## 2021-08-05 DIAGNOSIS — S72301A Unspecified fracture of shaft of right femur, initial encounter for closed fracture: Secondary | ICD-10-CM | POA: Diagnosis not present

## 2021-08-05 DIAGNOSIS — S92002A Unspecified fracture of left calcaneus, initial encounter for closed fracture: Secondary | ICD-10-CM | POA: Diagnosis not present

## 2021-08-05 DIAGNOSIS — S36039A Unspecified laceration of spleen, initial encounter: Secondary | ICD-10-CM | POA: Diagnosis present

## 2021-08-05 DIAGNOSIS — S72302A Unspecified fracture of shaft of left femur, initial encounter for closed fracture: Secondary | ICD-10-CM | POA: Diagnosis not present

## 2021-08-05 DIAGNOSIS — S32119A Unspecified Zone I fracture of sacrum, initial encounter for closed fracture: Secondary | ICD-10-CM | POA: Diagnosis present

## 2021-08-05 DIAGNOSIS — S81012A Laceration without foreign body, left knee, initial encounter: Secondary | ICD-10-CM

## 2021-08-05 DIAGNOSIS — S9305XA Dislocation of left ankle joint, initial encounter: Secondary | ICD-10-CM | POA: Diagnosis present

## 2021-08-05 DIAGNOSIS — S92113A Displaced fracture of neck of unspecified talus, initial encounter for closed fracture: Secondary | ICD-10-CM | POA: Diagnosis not present

## 2021-08-05 DIAGNOSIS — S92111B Displaced fracture of neck of right talus, initial encounter for open fracture: Secondary | ICD-10-CM | POA: Diagnosis not present

## 2021-08-05 DIAGNOSIS — S76122A Laceration of left quadriceps muscle, fascia and tendon, initial encounter: Secondary | ICD-10-CM | POA: Diagnosis present

## 2021-08-05 DIAGNOSIS — T1490XA Injury, unspecified, initial encounter: Secondary | ICD-10-CM | POA: Diagnosis present

## 2021-08-05 DIAGNOSIS — S72352A Displaced comminuted fracture of shaft of left femur, initial encounter for closed fracture: Secondary | ICD-10-CM | POA: Diagnosis present

## 2021-08-05 DIAGNOSIS — Z91011 Allergy to milk products: Secondary | ICD-10-CM | POA: Diagnosis not present

## 2021-08-05 DIAGNOSIS — S92002B Unspecified fracture of left calcaneus, initial encounter for open fracture: Secondary | ICD-10-CM | POA: Diagnosis present

## 2021-08-05 DIAGNOSIS — S37898A Other injury of other urinary and pelvic organ, initial encounter: Secondary | ICD-10-CM | POA: Diagnosis not present

## 2021-08-05 DIAGNOSIS — Z91014 Allergy to mammalian meats: Secondary | ICD-10-CM | POA: Diagnosis not present

## 2021-08-05 DIAGNOSIS — Z23 Encounter for immunization: Secondary | ICD-10-CM | POA: Diagnosis not present

## 2021-08-05 DIAGNOSIS — S72001A Fracture of unspecified part of neck of right femur, initial encounter for closed fracture: Secondary | ICD-10-CM | POA: Diagnosis not present

## 2021-08-05 DIAGNOSIS — S92112A Displaced fracture of neck of left talus, initial encounter for closed fracture: Secondary | ICD-10-CM | POA: Diagnosis present

## 2021-08-05 DIAGNOSIS — S72351A Displaced comminuted fracture of shaft of right femur, initial encounter for closed fracture: Secondary | ICD-10-CM | POA: Diagnosis present

## 2021-08-05 DIAGNOSIS — S3792XA Contusion of unspecified urinary and pelvic organ, initial encounter: Secondary | ICD-10-CM | POA: Diagnosis present

## 2021-08-05 DIAGNOSIS — N179 Acute kidney failure, unspecified: Secondary | ICD-10-CM | POA: Diagnosis present

## 2021-08-05 DIAGNOSIS — S86812A Strain of other muscle(s) and tendon(s) at lower leg level, left leg, initial encounter: Secondary | ICD-10-CM | POA: Diagnosis not present

## 2021-08-05 DIAGNOSIS — K567 Ileus, unspecified: Secondary | ICD-10-CM | POA: Diagnosis not present

## 2021-08-05 DIAGNOSIS — F430 Acute stress reaction: Secondary | ICD-10-CM | POA: Diagnosis present

## 2021-08-05 DIAGNOSIS — I96 Gangrene, not elsewhere classified: Secondary | ICD-10-CM | POA: Diagnosis not present

## 2021-08-05 DIAGNOSIS — S92251B Displaced fracture of navicular [scaphoid] of right foot, initial encounter for open fracture: Secondary | ICD-10-CM | POA: Diagnosis not present

## 2021-08-05 DIAGNOSIS — S32059A Unspecified fracture of fifth lumbar vertebra, initial encounter for closed fracture: Secondary | ICD-10-CM | POA: Diagnosis present

## 2021-08-05 DIAGNOSIS — S82831A Other fracture of upper and lower end of right fibula, initial encounter for closed fracture: Secondary | ICD-10-CM | POA: Diagnosis present

## 2021-08-05 DIAGNOSIS — Y92411 Interstate highway as the place of occurrence of the external cause: Secondary | ICD-10-CM | POA: Diagnosis not present

## 2021-08-05 DIAGNOSIS — S2242XA Multiple fractures of ribs, left side, initial encounter for closed fracture: Secondary | ICD-10-CM | POA: Diagnosis present

## 2021-08-05 DIAGNOSIS — D62 Acute posthemorrhagic anemia: Secondary | ICD-10-CM | POA: Diagnosis not present

## 2021-08-05 DIAGNOSIS — S92102A Unspecified fracture of left talus, initial encounter for closed fracture: Secondary | ICD-10-CM | POA: Diagnosis not present

## 2021-08-05 DIAGNOSIS — Z20822 Contact with and (suspected) exposure to covid-19: Secondary | ICD-10-CM | POA: Diagnosis present

## 2021-08-05 DIAGNOSIS — F17200 Nicotine dependence, unspecified, uncomplicated: Secondary | ICD-10-CM | POA: Diagnosis not present

## 2021-08-05 DIAGNOSIS — S92001B Unspecified fracture of right calcaneus, initial encounter for open fracture: Secondary | ICD-10-CM | POA: Diagnosis not present

## 2021-08-05 DIAGNOSIS — Z91013 Allergy to seafood: Secondary | ICD-10-CM | POA: Diagnosis not present

## 2021-08-05 DIAGNOSIS — S37012A Minor contusion of left kidney, initial encounter: Secondary | ICD-10-CM | POA: Diagnosis present

## 2021-08-05 DIAGNOSIS — I10 Essential (primary) hypertension: Secondary | ICD-10-CM | POA: Diagnosis not present

## 2021-08-05 HISTORY — PX: PATELLAR TENDON REPAIR: SHX737

## 2021-08-05 HISTORY — PX: EXTERNAL FIXATION LEG: SHX1549

## 2021-08-05 HISTORY — PX: I & D EXTREMITY: SHX5045

## 2021-08-05 HISTORY — PX: INSERTION OF TRACTION PIN: SHX6560

## 2021-08-05 LAB — CBC
HCT: 23.9 % — ABNORMAL LOW (ref 36.0–46.0)
HCT: 21.7 % — ABNORMAL LOW (ref 36.0–46.0)
Hemoglobin: 8.6 g/dL — ABNORMAL LOW (ref 12.0–15.0)
Hemoglobin: 8 g/dL — ABNORMAL LOW (ref 12.0–15.0)
MCH: 31.2 pg (ref 26.0–34.0)
MCH: 31.9 pg (ref 26.0–34.0)
MCHC: 36 g/dL (ref 30.0–36.0)
MCHC: 36.9 g/dL — ABNORMAL HIGH (ref 30.0–36.0)
MCV: 86.6 fL (ref 80.0–100.0)
MCV: 86.5 fL (ref 80.0–100.0)
Platelets: 123 10*3/uL — ABNORMAL LOW (ref 150–400)
Platelets: 128 10*3/uL — ABNORMAL LOW (ref 150–400)
RBC: 2.76 MIL/uL — ABNORMAL LOW (ref 3.87–5.11)
RBC: 2.51 MIL/uL — ABNORMAL LOW (ref 3.87–5.11)
RDW: 13.4 % (ref 11.5–15.5)
RDW: 13.5 % (ref 11.5–15.5)
WBC: 10.8 10*3/uL — ABNORMAL HIGH (ref 4.0–10.5)
WBC: 11.7 10*3/uL — ABNORMAL HIGH (ref 4.0–10.5)
nRBC: 0 % (ref 0.0–0.2)
nRBC: 0 % (ref 0.0–0.2)

## 2021-08-05 LAB — COMPREHENSIVE METABOLIC PANEL
ALT: 253 U/L — ABNORMAL HIGH (ref 0–44)
AST: 290 U/L — ABNORMAL HIGH (ref 15–41)
Albumin: 3.7 g/dL (ref 3.5–5.0)
Alkaline Phosphatase: 39 U/L (ref 38–126)
Anion gap: 13 (ref 5–15)
BUN: 17 mg/dL (ref 6–20)
CO2: 19 mmol/L — ABNORMAL LOW (ref 22–32)
Calcium: 8.8 mg/dL — ABNORMAL LOW (ref 8.9–10.3)
Chloride: 106 mmol/L (ref 98–111)
Creatinine, Ser: 1.67 mg/dL — ABNORMAL HIGH (ref 0.44–1.00)
GFR, Estimated: 40 mL/min — ABNORMAL LOW (ref >60)
Glucose, Bld: 232 mg/dL — ABNORMAL HIGH (ref 70–99)
Potassium: 3.6 mmol/L (ref 3.5–5.1)
Sodium: 138 mmol/L (ref 135–145)
Total Bilirubin: 0.8 mg/dL (ref 0.3–1.2)
Total Protein: 6 g/dL — ABNORMAL LOW (ref 6.5–8.1)

## 2021-08-05 LAB — URINALYSIS, ROUTINE W REFLEX MICROSCOPIC
Bilirubin Urine: NEGATIVE
Glucose, UA: NEGATIVE mg/dL
Ketones, ur: NEGATIVE mg/dL
Leukocytes,Ua: NEGATIVE
Nitrite: NEGATIVE
Protein, ur: 30 mg/dL — AB
RBC / HPF: >50 RBC/hpf — ABNORMAL HIGH (ref 0–5)
Specific Gravity, Urine: 1.039 — ABNORMAL HIGH (ref 1.005–1.030)
pH: 5 (ref 5.0–8.0)

## 2021-08-05 LAB — BASIC METABOLIC PANEL
Anion gap: 12 (ref 5–15)
BUN: 19 mg/dL (ref 6–20)
CO2: 23 mmol/L (ref 22–32)
Calcium: 8.1 mg/dL — ABNORMAL LOW (ref 8.9–10.3)
Chloride: 104 mmol/L (ref 98–111)
Creatinine, Ser: 1.42 mg/dL — ABNORMAL HIGH (ref 0.44–1.00)
GFR, Estimated: 49 mL/min — ABNORMAL LOW (ref >60)
Glucose, Bld: 170 mg/dL — ABNORMAL HIGH (ref 70–99)
Potassium: 3.5 mmol/L (ref 3.5–5.1)
Sodium: 139 mmol/L (ref 135–145)

## 2021-08-05 LAB — HEPATIC FUNCTION PANEL
ALT: 150 U/L — ABNORMAL HIGH (ref 0–44)
AST: 269 U/L — ABNORMAL HIGH (ref 15–41)
Albumin: 3.5 g/dL (ref 3.5–5.0)
Alkaline Phosphatase: 24 U/L — ABNORMAL LOW (ref 38–126)
Bilirubin, Direct: 0.4 mg/dL — ABNORMAL HIGH (ref 0.0–0.2)
Indirect Bilirubin: 0.8 mg/dL (ref 0.3–0.9)
Total Bilirubin: 1.2 mg/dL (ref 0.3–1.2)
Total Protein: 4.8 g/dL — ABNORMAL LOW (ref 6.5–8.1)

## 2021-08-05 LAB — RESP PANEL BY RT-PCR (FLU A&B, COVID) ARPGX2
Influenza A by PCR: NEGATIVE
Influenza B by PCR: NEGATIVE
SARS Coronavirus 2 by RT PCR: NEGATIVE

## 2021-08-05 LAB — ETHANOL: Alcohol, Ethyl (B): <10 mg/dL (ref <10)

## 2021-08-05 LAB — GLUCOSE, CAPILLARY
Glucose-Capillary: 145 mg/dL — ABNORMAL HIGH (ref 70–99)
Glucose-Capillary: 144 mg/dL — ABNORMAL HIGH (ref 70–99)
Glucose-Capillary: 125 mg/dL — ABNORMAL HIGH (ref 70–99)
Glucose-Capillary: 129 mg/dL — ABNORMAL HIGH (ref 70–99)

## 2021-08-05 LAB — MRSA NEXT GEN BY PCR, NASAL: MRSA by PCR Next Gen: NOT DETECTED

## 2021-08-05 LAB — HIV ANTIBODY (ROUTINE TESTING W REFLEX): HIV Screen 4th Generation wRfx: NONREACTIVE

## 2021-08-05 LAB — LACTIC ACID, PLASMA: Lactic Acid, Venous: 3.8 mmol/L (ref 0.5–1.9)

## 2021-08-05 SURGERY — IRRIGATION AND DEBRIDEMENT EXTREMITY
Anesthesia: General | Site: Leg Lower | Laterality: Right

## 2021-08-05 MED ORDER — ONDANSETRON HCL 4 MG/2ML IJ SOLN
INTRAMUSCULAR | Status: DC | PRN
  Administered 2021-08-05: 4 mg via INTRAVENOUS

## 2021-08-05 MED ORDER — EPHEDRINE SULFATE-NACL 50-0.9 MG/10ML-% IV SOSY
PREFILLED_SYRINGE | INTRAVENOUS | Status: DC | PRN
  Administered 2021-08-05: 10 mg via INTRAVENOUS

## 2021-08-05 MED ORDER — TRANEXAMIC ACID-NACL 1000-0.7 MG/100ML-% IV SOLN
INTRAVENOUS | Status: AC
  Filled 2021-08-05: qty 100

## 2021-08-05 MED ORDER — DIPHENHYDRAMINE HCL 50 MG/ML IJ SOLN: 12.5000 mg | Freq: Four times a day (QID) | INTRAMUSCULAR | Status: DC | PRN

## 2021-08-05 MED ORDER — AMISULPRIDE (ANTIEMETIC) 5 MG/2ML IV SOLN: 10.0000 mg | Freq: Once | INTRAVENOUS | Status: DC | PRN

## 2021-08-05 MED ORDER — FENTANYL CITRATE PF 50 MCG/ML IJ SOSY
100.0000 ug | PREFILLED_SYRINGE | Freq: Once | INTRAMUSCULAR | Status: AC
  Administered 2021-08-05: 100 ug via INTRAVENOUS

## 2021-08-05 MED ORDER — OXYCODONE HCL 5 MG/5ML PO SOLN: 5.0000 mg | Freq: Once | ORAL | Status: DC | PRN

## 2021-08-05 MED ORDER — TRANEXAMIC ACID-NACL 1000-0.7 MG/100ML-% IV SOLN
INTRAVENOUS | Status: DC | PRN
  Administered 2021-08-05: 1000 mg via INTRAVENOUS

## 2021-08-05 MED ORDER — SUCCINYLCHOLINE CHLORIDE 200 MG/10ML IV SOSY
PREFILLED_SYRINGE | INTRAVENOUS | Status: AC
  Filled 2021-08-05: qty 10

## 2021-08-05 MED ORDER — ROCURONIUM BROMIDE 10 MG/ML (PF) SYRINGE
PREFILLED_SYRINGE | INTRAVENOUS | Status: AC
  Filled 2021-08-05: qty 10

## 2021-08-05 MED ORDER — LACTATED RINGERS IV SOLN: INTRAVENOUS | Status: DC | PRN

## 2021-08-05 MED ORDER — DOCUSATE SODIUM 100 MG PO CAPS
100.0000 mg | ORAL_CAPSULE | Freq: Two times a day (BID) | ORAL | Status: DC
  Administered 2021-08-07 – 2021-08-09 (×5): 100 mg via ORAL
  Filled 2021-08-05 (×7): qty 1

## 2021-08-05 MED ORDER — IOHEXOL 350 MG/ML SOLN
100.0000 mL | Freq: Once | INTRAVENOUS | Status: AC | PRN
  Administered 2021-08-05: 100 mL via INTRAVENOUS

## 2021-08-05 MED ORDER — DIPHENHYDRAMINE HCL 12.5 MG/5ML PO ELIX: 12.5000 mg | ORAL_SOLUTION | Freq: Four times a day (QID) | ORAL | Status: DC | PRN

## 2021-08-05 MED ORDER — LACTATED RINGERS IV BOLUS
1000.0000 mL | Freq: Once | INTRAVENOUS | Status: AC
  Administered 2021-08-05: 1000 mL via INTRAVENOUS

## 2021-08-05 MED ORDER — CHLORHEXIDINE GLUCONATE CLOTH 2 % EX PADS
6.0000 | MEDICATED_PAD | Freq: Every day | CUTANEOUS | Status: DC
  Administered 2021-08-05 – 2021-08-19 (×13): 6 via TOPICAL

## 2021-08-05 MED ORDER — PROMETHAZINE HCL 25 MG/ML IJ SOLN: 6.2500 mg | INTRAMUSCULAR | Status: DC | PRN

## 2021-08-05 MED ORDER — METHOCARBAMOL 1000 MG/10ML IJ SOLN
500.0000 mg | Freq: Four times a day (QID) | INTRAVENOUS | Status: DC | PRN
  Administered 2021-08-05: 500 mg via INTRAVENOUS
  Filled 2021-08-05: qty 500

## 2021-08-05 MED ORDER — SODIUM BICARBONATE 8.4 % IV SOLN
INTRAVENOUS | Status: AC
  Filled 2021-08-05: qty 50

## 2021-08-05 MED ORDER — PHENYLEPHRINE 80 MCG/ML (10ML) SYRINGE FOR IV PUSH (FOR BLOOD PRESSURE SUPPORT)
PREFILLED_SYRINGE | INTRAVENOUS | Status: DC | PRN
  Administered 2021-08-05: 120 ug via INTRAVENOUS
  Administered 2021-08-05: 80 ug via INTRAVENOUS
  Administered 2021-08-05 (×2): 120 ug via INTRAVENOUS
  Administered 2021-08-05: 80 ug via INTRAVENOUS
  Administered 2021-08-05: 120 ug via INTRAVENOUS
  Administered 2021-08-05: 160 ug via INTRAVENOUS

## 2021-08-05 MED ORDER — ONDANSETRON HCL 4 MG/2ML IJ SOLN
4.0000 mg | Freq: Four times a day (QID) | INTRAMUSCULAR | Status: DC | PRN
  Administered 2021-08-05 – 2021-08-14 (×10): 4 mg via INTRAVENOUS
  Filled 2021-08-05 (×10): qty 2

## 2021-08-05 MED ORDER — SODIUM CHLORIDE 0.9% FLUSH: 9.0000 mL | INTRAVENOUS | Status: DC | PRN

## 2021-08-05 MED ORDER — VANCOMYCIN HCL 500 MG IV SOLR
INTRAVENOUS | Status: AC
  Filled 2021-08-05: qty 10

## 2021-08-05 MED ORDER — ROCURONIUM BROMIDE 10 MG/ML (PF) SYRINGE
PREFILLED_SYRINGE | INTRAVENOUS | Status: DC | PRN
  Administered 2021-08-05: 20 mg via INTRAVENOUS
  Administered 2021-08-05: 50 mg via INTRAVENOUS

## 2021-08-05 MED ORDER — ALBUMIN HUMAN 5 % IV SOLN: INTRAVENOUS | Status: DC | PRN

## 2021-08-05 MED ORDER — PHENYLEPHRINE 80 MCG/ML (10ML) SYRINGE FOR IV PUSH (FOR BLOOD PRESSURE SUPPORT)
PREFILLED_SYRINGE | INTRAVENOUS | Status: AC
  Filled 2021-08-05: qty 10

## 2021-08-05 MED ORDER — SUCCINYLCHOLINE CHLORIDE 200 MG/10ML IV SOSY
PREFILLED_SYRINGE | INTRAVENOUS | Status: DC | PRN
  Administered 2021-08-05: 80 mg via INTRAVENOUS

## 2021-08-05 MED ORDER — VANCOMYCIN HCL 500 MG IV SOLR
INTRAVENOUS | Status: DC | PRN
  Administered 2021-08-05: 500 mg

## 2021-08-05 MED ORDER — OXYCODONE HCL 5 MG PO TABS: 5.0000 mg | ORAL_TABLET | Freq: Once | ORAL | Status: DC | PRN

## 2021-08-05 MED ORDER — SUGAMMADEX SODIUM 200 MG/2ML IV SOLN
INTRAVENOUS | Status: DC | PRN
  Administered 2021-08-05: 100 mg via INTRAVENOUS

## 2021-08-05 MED ORDER — GABAPENTIN 300 MG PO CAPS
300.0000 mg | ORAL_CAPSULE | Freq: Three times a day (TID) | ORAL | Status: DC
  Administered 2021-08-05 – 2021-08-09 (×12): 300 mg via ORAL
  Filled 2021-08-05 (×12): qty 1

## 2021-08-05 MED ORDER — EPHEDRINE 5 MG/ML INJ
INTRAVENOUS | Status: AC
  Filled 2021-08-05: qty 5

## 2021-08-05 MED ORDER — MORPHINE SULFATE 1 MG/ML IV SOLN PCA
INTRAVENOUS | Status: DC
  Administered 2021-08-05: 9 mg via INTRAVENOUS
  Administered 2021-08-05: 1.5 mg via INTRAVENOUS
  Administered 2021-08-05: 10.5 mg via INTRAVENOUS
  Administered 2021-08-05: 1.5 mg via INTRAVENOUS
  Administered 2021-08-06: 1.5 mL via INTRAVENOUS
  Administered 2021-08-07: 1.5 mg via INTRAVENOUS
  Administered 2021-08-07: 7.5 mg via INTRAVENOUS
  Administered 2021-08-07: 4.5 mg via INTRAVENOUS
  Administered 2021-08-09 (×2): 3 mg via INTRAVENOUS
  Administered 2021-08-09: 1.5 mg via INTRAVENOUS
  Administered 2021-08-09: 3 mg via INTRAVENOUS
  Administered 2021-08-09: 4.5 mg via INTRAVENOUS
  Administered 2021-08-10: 7.47 mg via INTRAVENOUS
  Administered 2021-08-10 (×2): 6 mg via INTRAVENOUS
  Administered 2021-08-11: 12 mL via INTRAVENOUS
  Administered 2021-08-11: 1.5 mg via INTRAVENOUS
  Administered 2021-08-11: 9 mL via INTRAVENOUS
  Administered 2021-08-11: 6 mg via INTRAVENOUS
  Administered 2021-08-11: 1 mg via INTRAVENOUS
  Administered 2021-08-11: 1 mL via INTRAVENOUS
  Administered 2021-08-11: 7.5 mL via INTRAVENOUS
  Administered 2021-08-12: 6 mL via INTRAVENOUS
  Administered 2021-08-12: 6 mg via INTRAVENOUS
  Administered 2021-08-12: 1.5 mg via INTRAVENOUS
  Administered 2021-08-12: 7.5 mL via INTRAVENOUS
  Administered 2021-08-12: 6 mL via INTRAVENOUS
  Administered 2021-08-13 (×2): 3 mg via INTRAVENOUS
  Filled 2021-08-05 (×8): qty 30

## 2021-08-05 MED ORDER — ONDANSETRON HCL 4 MG/2ML IJ SOLN
INTRAMUSCULAR | Status: AC
  Filled 2021-08-05: qty 2

## 2021-08-05 MED ORDER — MORPHINE SULFATE (PF) 2 MG/ML IV SOLN
2.0000 mg | INTRAVENOUS | Status: DC | PRN
  Administered 2021-08-05 (×2): 2 mg via INTRAVENOUS
  Filled 2021-08-05 (×2): qty 1

## 2021-08-05 MED ORDER — CEFAZOLIN SODIUM-DEXTROSE 2-4 GM/100ML-% IV SOLN
INTRAVENOUS | Status: AC
  Filled 2021-08-05: qty 100

## 2021-08-05 MED ORDER — FENTANYL CITRATE (PF) 250 MCG/5ML IJ SOLN
INTRAMUSCULAR | Status: DC | PRN
Start: 2021-08-05 — End: 2021-08-05
  Administered 2021-08-05: 150 ug via INTRAVENOUS

## 2021-08-05 MED ORDER — OXYCODONE HCL 5 MG PO TABS
5.0000 mg | ORAL_TABLET | ORAL | Status: DC | PRN
  Filled 2021-08-05: qty 1

## 2021-08-05 MED ORDER — SODIUM CHLORIDE 0.9 % IV SOLN: INTRAVENOUS | Status: DC | PRN

## 2021-08-05 MED ORDER — VANCOMYCIN HCL 1000 MG IV SOLR
INTRAVENOUS | Status: AC
  Filled 2021-08-05: qty 20

## 2021-08-05 MED ORDER — LIDOCAINE 2% (20 MG/ML) 5 ML SYRINGE
INTRAMUSCULAR | Status: AC
  Filled 2021-08-05: qty 5

## 2021-08-05 MED ORDER — TRAMADOL HCL 50 MG PO TABS
50.0000 mg | ORAL_TABLET | Freq: Four times a day (QID) | ORAL | Status: DC | PRN
  Administered 2021-08-07 – 2021-08-09 (×3): 50 mg via ORAL
  Filled 2021-08-05 (×3): qty 1

## 2021-08-05 MED ORDER — SODIUM CHLORIDE 0.9 % IR SOLN
Status: DC | PRN
  Administered 2021-08-05 (×2): 3000 mL

## 2021-08-05 MED ORDER — PANTOPRAZOLE SODIUM 40 MG PO TBEC
40.0000 mg | DELAYED_RELEASE_TABLET | Freq: Every day | ORAL | Status: DC
  Administered 2021-08-05 – 2021-08-09 (×3): 40 mg via ORAL
  Filled 2021-08-05 (×3): qty 1

## 2021-08-05 MED ORDER — SODIUM CHLORIDE 0.9 % IV SOLN: INTRAVENOUS | Status: DC

## 2021-08-05 MED ORDER — 0.9 % SODIUM CHLORIDE (POUR BTL) OPTIME
TOPICAL | Status: DC | PRN
  Administered 2021-08-05: 1000 mL

## 2021-08-05 MED ORDER — LIDOCAINE 2% (20 MG/ML) 5 ML SYRINGE
INTRAMUSCULAR | Status: DC | PRN
  Administered 2021-08-05: 60 mg via INTRAVENOUS

## 2021-08-05 MED ORDER — CEFAZOLIN SODIUM-DEXTROSE 2-4 GM/100ML-% IV SOLN: 2.0000 g | Freq: Three times a day (TID) | INTRAVENOUS | Status: DC

## 2021-08-05 MED ORDER — LACTATED RINGERS IV SOLN: INTRAVENOUS | Status: DC

## 2021-08-05 MED ORDER — PHENYLEPHRINE HCL-NACL 20-0.9 MG/250ML-% IV SOLN
INTRAVENOUS | Status: DC | PRN
  Administered 2021-08-05: 75 ug/min via INTRAVENOUS

## 2021-08-05 MED ORDER — METHOCARBAMOL 500 MG PO TABS
1000.0000 mg | ORAL_TABLET | Freq: Three times a day (TID) | ORAL | Status: DC
  Administered 2021-08-05 – 2021-08-09 (×12): 1000 mg via ORAL
  Filled 2021-08-05 (×12): qty 2

## 2021-08-05 MED ORDER — IOHEXOL 300 MG/ML  SOLN
25.0000 mL | Freq: Once | INTRAMUSCULAR | Status: AC | PRN
  Administered 2021-08-05: 25 mL

## 2021-08-05 MED ORDER — ACETAMINOPHEN 10 MG/ML IV SOLN
1000.0000 mg | Freq: Once | INTRAVENOUS | Status: DC | PRN
  Administered 2021-08-05: 1000 mg via INTRAVENOUS

## 2021-08-05 MED ORDER — ONDANSETRON 4 MG PO TBDP
4.0000 mg | ORAL_TABLET | Freq: Four times a day (QID) | ORAL | Status: DC | PRN
  Administered 2021-08-09: 4 mg via ORAL
  Filled 2021-08-05: qty 1

## 2021-08-05 MED ORDER — NALOXONE HCL 0.4 MG/ML IJ SOLN: 0.4000 mg | INTRAMUSCULAR | Status: DC | PRN

## 2021-08-05 MED ORDER — PROPOFOL 10 MG/ML IV BOLUS
INTRAVENOUS | Status: DC | PRN
  Administered 2021-08-05: 100 mg via INTRAVENOUS

## 2021-08-05 MED ORDER — SODIUM CHLORIDE 0.9 % IV SOLN
2.0000 g | INTRAVENOUS | Status: DC
  Administered 2021-08-05 – 2021-08-06 (×2): 2 g via INTRAVENOUS
  Filled 2021-08-05 (×2): qty 20

## 2021-08-05 MED ORDER — DEXAMETHASONE SODIUM PHOSPHATE 10 MG/ML IJ SOLN
INTRAMUSCULAR | Status: DC | PRN
  Administered 2021-08-05: 5 mg via INTRAVENOUS

## 2021-08-05 MED ORDER — PROPOFOL 10 MG/ML IV BOLUS
INTRAVENOUS | Status: AC
  Filled 2021-08-05: qty 20

## 2021-08-05 MED ORDER — FENTANYL CITRATE (PF) 100 MCG/2ML IJ SOLN
25.0000 ug | INTRAMUSCULAR | Status: DC | PRN
  Administered 2021-08-05: 50 ug via INTRAVENOUS

## 2021-08-05 MED ORDER — CALCIUM CARBONATE ANTACID 500 MG PO CHEW
1.0000 | CHEWABLE_TABLET | Freq: Three times a day (TID) | ORAL | Status: DC
  Administered 2021-08-05 – 2021-08-06 (×4): 200 mg via ORAL
  Filled 2021-08-05 (×4): qty 1

## 2021-08-05 MED ORDER — MIDAZOLAM HCL 2 MG/2ML IJ SOLN
INTRAMUSCULAR | Status: AC
  Filled 2021-08-05: qty 2

## 2021-08-05 MED ORDER — FENTANYL CITRATE (PF) 100 MCG/2ML IJ SOLN
INTRAMUSCULAR | Status: AC
  Administered 2021-08-05: 100 ug
  Filled 2021-08-05: qty 10

## 2021-08-05 MED ORDER — ARTIFICIAL TEARS OPHTHALMIC OINT
TOPICAL_OINTMENT | OPHTHALMIC | Status: AC
  Filled 2021-08-05: qty 3.5

## 2021-08-05 MED ORDER — SODIUM BICARBONATE 8.4 % IV SOLN
INTRAVENOUS | Status: DC | PRN
  Administered 2021-08-05: 50 meq via INTRAVENOUS

## 2021-08-05 MED ORDER — MIDAZOLAM HCL 2 MG/2ML IJ SOLN
INTRAMUSCULAR | Status: DC | PRN
  Administered 2021-08-05: 2 mg via INTRAVENOUS

## 2021-08-05 MED ORDER — CEFAZOLIN SODIUM-DEXTROSE 2-3 GM-%(50ML) IV SOLR
INTRAVENOUS | Status: DC | PRN
  Administered 2021-08-05: 2 g via INTRAVENOUS

## 2021-08-05 MED ORDER — FENTANYL CITRATE (PF) 250 MCG/5ML IJ SOLN
INTRAMUSCULAR | Status: AC
  Filled 2021-08-05: qty 5

## 2021-08-05 MED ORDER — DEXAMETHASONE SODIUM PHOSPHATE 10 MG/ML IJ SOLN
INTRAMUSCULAR | Status: AC
Start: 2021-08-05 — End: ?
  Filled 2021-08-05: qty 1

## 2021-08-05 MED ORDER — ACETAMINOPHEN 10 MG/ML IV SOLN
INTRAVENOUS | Status: AC
  Filled 2021-08-05: qty 100

## 2021-08-05 MED ORDER — ACETAMINOPHEN 500 MG PO TABS
1000.0000 mg | ORAL_TABLET | Freq: Four times a day (QID) | ORAL | Status: DC
  Administered 2021-08-05 – 2021-08-09 (×11): 1000 mg via ORAL
  Filled 2021-08-05 (×14): qty 2

## 2021-08-05 MED ORDER — SODIUM CHLORIDE 0.9 % IV SOLN: 12.5000 mg | Freq: Four times a day (QID) | INTRAVENOUS | Status: DC | PRN

## 2021-08-05 MED ORDER — BISACODYL 10 MG RE SUPP: 10.0000 mg | Freq: Every day | RECTAL | Status: DC | PRN

## 2021-08-05 MED ORDER — ONDANSETRON HCL 4 MG/2ML IJ SOLN: 4.0000 mg | Freq: Four times a day (QID) | INTRAMUSCULAR | Status: DC | PRN

## 2021-08-05 MED ORDER — FENTANYL CITRATE (PF) 100 MCG/2ML IJ SOLN
INTRAMUSCULAR | Status: AC
  Filled 2021-08-05: qty 2

## 2021-08-05 MED ORDER — VANCOMYCIN HCL 1000 MG IV SOLR
INTRAVENOUS | Status: DC | PRN
  Administered 2021-08-05: 1000 mg

## 2021-08-05 MED ORDER — OXYCODONE HCL 5 MG PO TABS
10.0000 mg | ORAL_TABLET | ORAL | Status: DC | PRN
  Administered 2021-08-05 – 2021-08-09 (×9): 10 mg via ORAL
  Filled 2021-08-05 (×10): qty 2

## 2021-08-05 SURGICAL SUPPLY — 85 items
BAG COUNTER SPONGE SURGICOUNT (BAG) ×5 IMPLANT
BANDAGE ESMARK 6X9 LF (GAUZE/BANDAGES/DRESSINGS) ×4 IMPLANT
BNDG COHESIVE 4X5 TAN ST LF (GAUZE/BANDAGES/DRESSINGS) ×1 IMPLANT
BNDG COHESIVE 4X5 TAN STRL (GAUZE/BANDAGES/DRESSINGS) ×5 IMPLANT
BNDG COHESIVE 6X5 TAN STRL LF (GAUZE/BANDAGES/DRESSINGS) IMPLANT
BNDG ELASTIC 4X5.8 VLCR NS LF (GAUZE/BANDAGES/DRESSINGS) ×3 IMPLANT
BNDG ELASTIC 4X5.8 VLCR STR LF (GAUZE/BANDAGES/DRESSINGS) ×5 IMPLANT
BNDG ELASTIC 6X5.8 VLCR STR LF (GAUZE/BANDAGES/DRESSINGS) ×5 IMPLANT
BNDG ESMARK 6X9 LF (GAUZE/BANDAGES/DRESSINGS) ×5
BNDG GAUZE ELAST 4 BULKY (GAUZE/BANDAGES/DRESSINGS) ×10 IMPLANT
BRUSH SCRUB EZ PLAIN DRY (MISCELLANEOUS) ×11 IMPLANT
COVER MAYO STAND STRL (DRAPES) ×1 IMPLANT
COVER SURGICAL LIGHT HANDLE (MISCELLANEOUS) ×10 IMPLANT
DRAPE BILATERAL LIMB T (DRAPES) ×1 IMPLANT
DRAPE C-ARM 42X72 X-RAY (DRAPES) ×5 IMPLANT
DRAPE C-ARMOR (DRAPES) ×5 IMPLANT
DRAPE U-SHAPE 47X51 STRL (DRAPES) ×6 IMPLANT
DRSG ADAPTIC 3X8 NADH LF (GAUZE/BANDAGES/DRESSINGS) ×5 IMPLANT
DRSG VAC ATS MED SENSATRAC (GAUZE/BANDAGES/DRESSINGS) IMPLANT
ELECT REM PT RETURN 9FT ADLT (ELECTROSURGICAL) ×5
ELECTRODE REM PT RTRN 9FT ADLT (ELECTROSURGICAL) ×4 IMPLANT
GAUZE SPONGE 4X4 12PLY STRL (GAUZE/BANDAGES/DRESSINGS) ×5 IMPLANT
GAUZE SPONGE 4X4 12PLY STRL LF (GAUZE/BANDAGES/DRESSINGS) ×2 IMPLANT
GAUZE XEROFORM 5X9 LF (GAUZE/BANDAGES/DRESSINGS) ×7 IMPLANT
GLOVE BIO SURGEON STRL SZ7.5 (GLOVE) ×5 IMPLANT
GLOVE BIO SURGEON STRL SZ8 (GLOVE) ×6 IMPLANT
GLOVE BIOGEL PI IND STRL 6.5 (GLOVE) IMPLANT
GLOVE BIOGEL PI IND STRL 7.0 (GLOVE) IMPLANT
GLOVE BIOGEL PI IND STRL 7.5 (GLOVE) ×4 IMPLANT
GLOVE BIOGEL PI IND STRL 8 (GLOVE) ×8 IMPLANT
GLOVE BIOGEL PI IND STRL 9 (GLOVE) ×4 IMPLANT
GLOVE BIOGEL PI INDICATOR 6.5 (GLOVE) ×1
GLOVE BIOGEL PI INDICATOR 7.0 (GLOVE) ×1
GLOVE BIOGEL PI INDICATOR 7.5 (GLOVE) ×1
GLOVE BIOGEL PI INDICATOR 8 (GLOVE) ×3
GLOVE BIOGEL PI INDICATOR 9 (GLOVE) ×1
GLOVE SURG ORTHO LTX SZ7.5 (GLOVE) ×10 IMPLANT
GLOVE SURG SS PI 8.0 STRL IVOR (GLOVE) ×2 IMPLANT
GLOVE SURG SYN 7.5  E (GLOVE) ×1
GLOVE SURG SYN 7.5 E (GLOVE) ×4 IMPLANT
GLOVE SURG SYN 7.5 PF PI (GLOVE) ×4 IMPLANT
GOWN STRL REUS W/ TWL LRG LVL3 (GOWN DISPOSABLE) ×8 IMPLANT
GOWN STRL REUS W/ TWL XL LVL3 (GOWN DISPOSABLE) ×8 IMPLANT
GOWN STRL REUS W/TWL LRG LVL3 (GOWN DISPOSABLE) ×2
GOWN STRL REUS W/TWL XL LVL3 (GOWN DISPOSABLE) ×2
HANDPIECE INTERPULSE COAX TIP (DISPOSABLE) ×1
KIT BASIN OR (CUSTOM PROCEDURE TRAY) ×5 IMPLANT
KIT TURNOVER KIT B (KITS) ×5 IMPLANT
MANIFOLD NEPTUNE II (INSTRUMENTS) ×5 IMPLANT
MARKER SKIN DUAL TIP RULER LAB (MISCELLANEOUS) ×1 IMPLANT
NS IRRIG 1000ML POUR BTL (IV SOLUTION) ×5 IMPLANT
PACK ORTHO EXTREMITY (CUSTOM PROCEDURE TRAY) ×5 IMPLANT
PAD ARMBOARD 7.5X6 YLW CONV (MISCELLANEOUS) ×10 IMPLANT
PAD CAST 4YDX4 CTTN HI CHSV (CAST SUPPLIES) IMPLANT
PADDING CAST COTTON 4X4 STRL (CAST SUPPLIES) ×3
PADDING CAST COTTON 6X4 STRL (CAST SUPPLIES) ×10 IMPLANT
PIN APEX 120 EXFIX (EXFIX) ×2 IMPLANT
PIN CLAMP 5H 30DEG POST (EXFIX) ×1 IMPLANT
PIN HALF 150X50 SD SHOOTZ STRL (Pin) IMPLANT
PIN STMN SNGL NS 9X3/32 2.4 (BIT) ×1 IMPLANT
PIN TO ROD COUPLING EXFIX (EXFIX) ×4 IMPLANT
PIN TRANSFIX 615X300 EXFIX (EXFIX) ×1 IMPLANT
PINS SHOOTZ (Pin) ×10 IMPLANT
ROD HOFFMANN3 CONNECT 11X150 (EXFIX) ×2 IMPLANT
ROD HOFFMANN3 CONNECT 11X350 (EXFIX) ×2 IMPLANT
ROD TO ROD COUPLING EXFIX (EXFIX) ×4 IMPLANT
SET HNDPC FAN SPRY TIP SCT (DISPOSABLE) IMPLANT
SOL PREP POV-IOD 4OZ 10% (MISCELLANEOUS) ×5 IMPLANT
SOL SCRUB PVP POV-IOD 4OZ 7.5% (MISCELLANEOUS) ×5
SOLUTION SCRB POV-IOD 4OZ 7.5% (MISCELLANEOUS) ×4 IMPLANT
SPONGE DRAIN TRACH 4X4 STRL 2S (GAUZE/BANDAGES/DRESSINGS) ×1 IMPLANT
SPONGE T-LAP 18X18 ~~LOC~~+RFID (SPONGE) ×5 IMPLANT
STOCKINETTE IMPERVIOUS 9X36 MD (GAUZE/BANDAGES/DRESSINGS) ×1 IMPLANT
STOCKINETTE IMPERVIOUS LG (DRAPES) IMPLANT
SUT ETHILON 2 0 PSLX (SUTURE) ×2 IMPLANT
SUT ETHILON 3 0 FSL (SUTURE) ×3 IMPLANT
SUT PDS AB 2-0 CT1 27 (SUTURE) IMPLANT
SUT VIC AB 0 CT2 27 (SUTURE) ×2 IMPLANT
TOWEL GREEN STERILE (TOWEL DISPOSABLE) ×10 IMPLANT
TOWEL GREEN STERILE FF (TOWEL DISPOSABLE) ×5 IMPLANT
TRAY FOLEY MTR SLVR 14FR STAT (SET/KITS/TRAYS/PACK) ×1 IMPLANT
TUBE CONNECTING 12X1/4 (SUCTIONS) ×5 IMPLANT
UNDERPAD 30X36 HEAVY ABSORB (UNDERPADS AND DIAPERS) ×5 IMPLANT
WATER STERILE IRR 1000ML POUR (IV SOLUTION) ×5 IMPLANT
YANKAUER SUCT BULB TIP NO VENT (SUCTIONS) ×5 IMPLANT

## 2021-08-05 NOTE — ED Notes (Signed)
R ankle reduced and splinted by Dr. Bebe Shaggy and ortho, pedal pulse found with vascular doppler
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
## 2021-08-05 NOTE — Progress Notes (Signed)
Patient experiencing new onset, localized, sharp pain to right ankle 10/10 unrelieved by morphine PCA. Pulses were previously palpable, but now dopplered. Notified Dr. Bedelia Person and Dr. Blanchie Dessert.   Dr. Blanchie Dessert instructed that we could reposition (recently elevated on padding) and remove padding now. MD also instructed to monitor pulses. Okay for dopplered pulses, but notify again if loss of pulse per Dr. Carver Fila.

## 2021-08-05 NOTE — Anesthesia Preprocedure Evaluation (Addendum)
Anesthesia Evaluation  Patient identified by MRN, date of birth, ID band Patient awake    Reviewed: Allergy & Precautions, NPO status , Patient's Chart, lab work & pertinent test results  Airway Mallampati: II  TM Distance: >3 FB Neck ROM: Full    Dental no notable dental hx. (+)    Pulmonary Current Smoker and Patient abstained from smoking.,    Pulmonary exam normal        Cardiovascular  Rhythm:Regular Rate:Tachycardia     Neuro/Psych PSYCHIATRIC DISORDERS Anxiety Depression    GI/Hepatic Neg liver ROS, GERD  Medicated and Controlled,  Endo/Other  negative endocrine ROS  Renal/GU negative Renal ROS     Musculoskeletal negative musculoskeletal ROS (+)   Abdominal   Peds  Hematology  (+) Blood dyscrasia, anemia ,   Anesthesia Other Findings Polytrauma  Reproductive/Obstetrics hcg negative                            Anesthesia Physical  Anesthesia Plan  ASA: 2  Anesthesia Plan: General   Post-op Pain Management:    Induction: Intravenous  PONV Risk Score and Plan: 2 and Ondansetron, Dexamethasone, Midazolam and Treatment may vary due to age or medical condition  Airway Management Planned: Oral ETT  Additional Equipment: Arterial line  Intra-op Plan:   Post-operative Plan: Extubation in OR  Informed Consent: I have reviewed the patients History and Physical, chart, labs and discussed the procedure including the risks, benefits and alternatives for the proposed anesthesia with the patient or authorized representative who has indicated his/her understanding and acceptance.     Dental advisory given  Plan Discussed with: CRNA  Anesthesia Plan Comments:        Anesthesia Quick Evaluation

## 2021-08-05 NOTE — Op Note (Signed)
08/04/2021 - 08/05/2021  4:17 AM  PATIENT:  Shelley [name]    PRE-OPERATIVE DIAGNOSIS: Open subtalar fracture dislocation, bilateral closed femur fracture, bilateral open knee wounds  POST-OPERATIVE DIAGNOSIS: Open talar neck fracture dislocation with talar head extrusion, calcaneus fracture, bilateral closed femur fractures, right superficial knee lacerations, left anterior knee laceration with partial patellar tendon laceration   PROCEDURE: Irrigation debridement bilateral knee wounds and right open subtalar fracture dislocation, closed reduction internal fixation of talar neck fracture and subtalar dislocation, application of right ankle spanning Ex-Fix, insertion of K wire for skeletal traction on the left, application of bilateral lower extremity skeletal traction, repair of partial left patellar tendon laceration.  SURGEON:  Nehemyah Foushee A Patte Winkel, MD  PHYSICIAN ASSISTANT: none  ANESTHESIA:   General  PREOPERATIVE INDICATIONS:  Shelley [name] is a  36 y.o. female who was brought emergently for unstable right subtalar fracture dislocation with a grade 3 open injury to the operating room for irrigation debridement, reduction and stabilization with external fixator.  Also for irrigation debridement bilateral knee wounds and application of bilateral skeletal traction.  Discussed plan for return to the OR in the coming days for definitive fixation management of her fractures.  The risks benefits and alternatives were discussed with the patient preoperatively including but not limited to the risks of infection, bleeding, nerve injury, cardiopulmonary complications, the need for revision surgery, among others, and the patient was willing to proceed.  ESTIMATED BLOOD LOSS: 75cc  OPERATIVE IMPLANTS:  Stryker external fixator with 5.0 half pins in the tibia, 5.0 transcalcaneal pin, four-point zero half pins in the first and fifth metatarsals.   OPERATIVE PROCEDURE:  Patient was brought to the  operating room.  General anesthesia was induced.  She was transferred to the operating room table.  Bilateral lower extremities were prepped and draped in a sterile fashion.  The patient was given 2 g of Ancef and 1 g of TXA.  Timeout was called.  First turned my attention to the right subtalar fracture dislocation.  There was a 15 x 4 x 2 cm oblique laceration across the medial ankle.  The talar head was found to be extruded medially with minimal soft tissue attachments remaining.  This was able to be reduced into its anatomic position.  Small amount of debris was noted in the wound and this was excised, and the wound was thoroughly debrided.  The wound was irrigated with 3 L of normal saline.  We then turned our turned our attention to constructing our external fixator.  Using fluoroscopy a transcalcaneal was pin was placed posteriorly.  2 tibial half pins were inserted.  A first and fifth metatarsal half pin was also placed.  This was connected with pin and bar clamps.  Reduction maneuver was performed and the construct was tightened.  Lateral x-ray demonstrated reduction of the talar head.  Given severe comminution of the calcaneus difficult to appreciate alignment on the AP of the foot.  We next evaluated the right knee wound.  There was a 5 x 2 x 1 cm C-shaped laceration over the anterior knee.  This was explored.  Joint capsule was intact without defect.  Small amount of gross debris was identified and excised.  There was also a transverse laceration over the lateral knee measuring 5 x 2 x 1 cm.  This also had small amount of gross debris that was excised.  All the wounds on the right lower extremity were thoroughly irrigated.  1 g vancomycin was placed in the wounds.  The wounds were all closed with a combination of 2-0 and 3-0 nylon.  Sterile soft dressing was applied Xeroform, 4 x 4 gauze, cast padding, Ace wrap..   We next explored the anterior wound on the left knee.  There was a 5 x 2 x 1 cm  laceration over the anterior knee.  There was a small amount of gross debris.  The laceration went through the periosteum at the inferior aspect of the patellar tendon without complete disruption of the patellar tendon.  No defect of the joint capsule was appreciated.  The wound was thoroughly irrigated with normal saline.  The partial patellar tendon laceration was repaired with 0 Vicryl.  The skin was closed with 3-0 nylon.  We next turned attention to placing a skeletal traction pin in the proximal tibia.  A 2.4 mm K wire was placed from lateral to medial 2 cm distal and posterior to the tibial tubercle.  Sterile soft dressing was applied to the left leg with Xeroform, 4 x 4 gauze, cast padding, Ace wrap..  The patient was then awoken from anesthesia and taken to the PACU in stable condition.   Debridement type: Excisional Debridement   Side: bilateral   Body Location: right ankle, right knee, left knee   Tools used for debridement: scalpel, scissors, cobb, currette, and rongeur   Pre-debridement Wound size (cm):   Length: 30        Width: 10     Depth: 5    Post-debridement Wound size (cm):   Length: 30        Width: 10     Depth: 5    Debridement depth beyond dead/damaged tissue down to healthy viable tissue: yes   Tissue layer involved: skin, subcutaneous tissue, muscle, bone   Nature of tissue removed: Devitalized Tissue   Irrigation volume: 6 L      Irrigation fluid type: Normal Saline    Post op recs: WB: Nonweightbearing bilateral lower extremities in 15 pounds skeletal traction each leg.  Pelvic binder for pelvic ring injury Abx: Ceftriaxone per open fracture protocol Imaging: PACU xrays Dressing: keep intact until return to OR DVT prophylaxis: Per trauma team okay to start postop day 0 Follow up: Plan to return to the OR for definitive fixation of injuries  Weber Cooks, MD Orthopaedic Surgery

## 2021-08-05 NOTE — Progress Notes (Signed)
Orthopedic Tech Progress Note Patient Details:  Shelley [name] [birth year] [id number]  Ortho Devices Type of Ortho Device: Post (short leg) splint, Stirrup splint Ortho Device/Splint Location: rle Ortho Device/Splint Interventions: Ordered, Application, Adjustment  I attended trauma page. Dr assisted with splint application post reduction. Post Interventions Patient Tolerated: Well Instructions Provided: Care of device, Adjustment of device  [name] 08/05/2021, 3:14 AM

## 2021-08-05 NOTE — Transfer of Care (Signed)
Immediate Anesthesia Transfer of Care Note  Patient: Baird Cancer  Procedure(s) Performed: IRRIGATION AND DEBRIDEMENT BILATERAL KNEES (Bilateral: Knee) EXTERNAL FIXATION ANKLE  (Right: Ankle) INSERTION OF TRACTION PIN BELOW KNEE (Left: Leg Lower)  Patient Location: PACU  Anesthesia Type:General  Level of Consciousness: awake, alert  and oriented  Airway & Oxygen Therapy: Patient Spontanous Breathing and Patient connected to face mask oxygen  Post-op Assessment: Report given to RN and Post -op Vital signs reviewed and stable  Post vital signs: Reviewed and stable  Last Vitals:  Vitals Value Taken Time  BP 125/75 08/05/21 0436  Temp    Pulse 97 08/05/21 0442  Resp 29 08/05/21 0442  SpO2 96 % 08/05/21 0442  Vitals shown include unvalidated device data.  Last Pain:  Vitals:   08/04/21 2331  PainSc: 9          Complications: No notable events documented.

## 2021-08-05 NOTE — Progress Notes (Signed)
PT Cancellation Note  Patient Details Name: Wanita Derenzo MRN: [id number] DOB: 09/29/1985   Cancelled Treatment:    Reason Eval/Treat Not Completed: Patient not medically ready; RN reports pt likely for ORIF tomorrow.  Will continue attempts.    Elray Mcgregor 08/05/2021, 2:33 PM Sheran Lawless, PT Acute Rehabilitation Services Office:[phone] 08/05/2021

## 2021-08-05 NOTE — Anesthesia Postprocedure Evaluation (Signed)
Anesthesia Post Note  Patient: Baird Cancer  Procedure(s) Performed: IRRIGATION AND DEBRIDEMENT BILATERAL KNEES (Bilateral: Knee) EXTERNAL FIXATION ANKLE  (Right: Ankle) INSERTION OF TRACTION PIN BELOW KNEE (Left: Leg Lower)     Patient location during evaluation: PACU Anesthesia Type: General Level of consciousness: awake Pain management: pain level controlled Vital Signs Assessment: post-procedure vital signs reviewed and stable Respiratory status: spontaneous breathing, nonlabored ventilation, respiratory function stable and patient connected to nasal cannula oxygen Cardiovascular status: blood pressure returned to baseline and stable Postop Assessment: no apparent nausea or vomiting Anesthetic complications: no   No notable events documented.  Last Vitals:  Vitals:   08/05/21 0559 08/05/21 0600  BP: (!) 106/95 112/73  Pulse: 99 93  Resp: 18 (!) 22  Temp: (!) 36.4 C   SpO2: 100% 100%    Last Pain:  Vitals:   08/05/21 0559  TempSrc: Oral  PainSc: 10-Worst pain ever                 Marlowe Lawes P Leray Garverick
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
## 2021-08-05 NOTE — Progress Notes (Signed)
Patient having increased pain in left arm and unable to move related to pain. Pulses present, cap refill present and able to grip hand. Dr. Bedelia Person notified. With light palpation of arm, noted tenderness of upper arm below shoulder on the lateral side and upper lateral side of forearm. Dr. Bedelia Person placed orders for X-Ray.

## 2021-08-05 NOTE — Anesthesia Preprocedure Evaluation (Addendum)
Anesthesia Evaluation  Patient identified by MRN, date of birth, ID band Patient awake    Reviewed: Allergy & Precautions, NPO status , Patient's Chart, lab work & pertinent test resultsPreop documentation limited or incomplete due to emergent nature of procedure.  Airway Mallampati: III  TM Distance: >3 FB Neck ROM: Limited    Dental  (+) Loose,    Pulmonary Current Smoker,    Pulmonary exam normal        Cardiovascular  Rhythm:Regular Rate:Tachycardia     Neuro/Psych PSYCHIATRIC DISORDERS Anxiety Depression  C-spine not cleared    GI/Hepatic (+)     substance abuse  ,   Endo/Other    Renal/GU Renal disease     Musculoskeletal   Abdominal   Peds  Hematology   Anesthesia Other Findings OPEN FRACTURE, RIGHT ANKLE  Reproductive/Obstetrics hcg negative                            Anesthesia Physical Anesthesia Plan  ASA: 2 and emergent  Anesthesia Plan: General   Post-op Pain Management:    Induction: Intravenous and Rapid sequence  PONV Risk Score and Plan: 2 and Ondansetron, Dexamethasone, Midazolam and Treatment may vary due to age or medical condition  Airway Management Planned: Oral ETT and Video Laryngoscope Planned  Additional Equipment:   Intra-op Plan:   Post-operative Plan: Extubation in OR  Informed Consent: I have reviewed the patients History and Physical, chart, labs and discussed the procedure including the risks, benefits and alternatives for the proposed anesthesia with the patient or authorized representative who has indicated his/her understanding and acceptance.     Dental advisory given  Plan Discussed with: CRNA  Anesthesia Plan Comments:         Anesthesia Quick Evaluation

## 2021-08-05 NOTE — Progress Notes (Signed)
OT Cancellation Note  Patient Details Name: Shelley [name] MRN: [id number] DOB: July 06, 1985   Cancelled Treatment:    Reason Eval/Treat Not Completed: Patient not medically ready (Per RN pt not appropriate for therapy eval today, possible plan for ORIF tomorrow. OT evaluation to f/u as able)  Margie Brink A Namiko Pritts 08/05/2021, 1:18 PM
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
## 2021-08-05 NOTE — Progress Notes (Signed)
Orthopedic Tech Progress Note Patient Details:  Margene Cherian 11-30-1985 [id number]  Ortho Devices Type of Ortho Device: Post (short leg) splint, Stirrup splint Ortho Device/Splint Location: rle Ortho Device/Splint Interventions: Ordered, Application, Adjustment   Post Interventions Patient Tolerated: Well Instructions Provided: Care of device, Adjustment of device  [name] 08/05/2021, 6:10 AM

## 2021-08-05 NOTE — ED Notes (Signed)
Shelley [name], husband, [phone] is in consult A

## 2021-08-05 NOTE — Progress Notes (Signed)
Orthopedic Tech Progress Note Patient Details:  Shelley [name] 1985/07/09 [id number]  Ortho Devices Type of Ortho Device: CAM walker Ortho Device/Splint Location: lle Ortho Device/Splint Interventions: Ordered, Application, Adjustment   Post Interventions Patient Tolerated: Well Instructions Provided: Care of device, Adjustment of device  [name] 08/05/2021, 6:11 AM
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
## 2021-08-05 NOTE — Anesthesia Procedure Notes (Signed)
Procedure Name: Intubation Date/Time: 08/05/2021 1:57 AM  Performed by: Laruth Bouchard., CRNAPre-anesthesia Checklist: Patient identified, Emergency Drugs available, Suction available, Patient being monitored and Timeout performed Patient Re-evaluated:Patient Re-evaluated prior to induction Oxygen Delivery Method: Circle system utilized Preoxygenation: Pre-oxygenation with 100% oxygen Induction Type: IV induction, Rapid sequence and Cricoid Pressure applied Laryngoscope Size: Glidescope and 3 Grade View: Grade I Tube type: Oral Tube size: 7.0 mm Number of attempts: 1 Airway Equipment and Method: Rigid stylet and Video-laryngoscopy Placement Confirmation: ETT inserted through vocal cords under direct vision, positive ETCO2 and breath sounds checked- equal and bilateral Secured at: 22 cm Tube secured with: Tape Dental Injury: Teeth and Oropharynx as per pre-operative assessment

## 2021-08-06 ENCOUNTER — Inpatient Hospital Stay (HOSPITAL_COMMUNITY): Payer: Medicaid Other

## 2021-08-06 ENCOUNTER — Other Ambulatory Visit: Payer: Self-pay

## 2021-08-06 ENCOUNTER — Encounter (HOSPITAL_COMMUNITY): Admission: EM | Disposition: A | Discharge status: Rehabilitation Facility | Payer: Self-pay | Source: Home / Self Care

## 2021-08-06 ENCOUNTER — Inpatient Hospital Stay (HOSPITAL_COMMUNITY): Payer: Medicaid Other | Admitting: Certified Registered Nurse Anesthetist

## 2021-08-06 ENCOUNTER — Encounter (HOSPITAL_COMMUNITY): Payer: Self-pay

## 2021-08-06 DIAGNOSIS — S72351A Displaced comminuted fracture of shaft of right femur, initial encounter for closed fracture: Secondary | ICD-10-CM

## 2021-08-06 DIAGNOSIS — S92111B Displaced fracture of neck of right talus, initial encounter for open fracture: Secondary | ICD-10-CM

## 2021-08-06 DIAGNOSIS — S2249XA Multiple fractures of ribs, unspecified side, initial encounter for closed fracture: Secondary | ICD-10-CM

## 2021-08-06 DIAGNOSIS — S72302A Unspecified fracture of shaft of left femur, initial encounter for closed fracture: Secondary | ICD-10-CM | POA: Diagnosis not present

## 2021-08-06 DIAGNOSIS — S37019A Minor contusion of unspecified kidney, initial encounter: Secondary | ICD-10-CM

## 2021-08-06 DIAGNOSIS — S72301A Unspecified fracture of shaft of right femur, initial encounter for closed fracture: Secondary | ICD-10-CM

## 2021-08-06 DIAGNOSIS — S92112A Displaced fracture of neck of left talus, initial encounter for closed fracture: Secondary | ICD-10-CM

## 2021-08-06 DIAGNOSIS — S92011B Displaced fracture of body of right calcaneus, initial encounter for open fracture: Secondary | ICD-10-CM

## 2021-08-06 DIAGNOSIS — S36039A Unspecified laceration of spleen, initial encounter: Secondary | ICD-10-CM

## 2021-08-06 DIAGNOSIS — S329XXA Fracture of unspecified parts of lumbosacral spine and pelvis, initial encounter for closed fracture: Secondary | ICD-10-CM

## 2021-08-06 DIAGNOSIS — S37898A Other injury of other urinary and pelvic organ, initial encounter: Secondary | ICD-10-CM

## 2021-08-06 DIAGNOSIS — S72352A Displaced comminuted fracture of shaft of left femur, initial encounter for closed fracture: Secondary | ICD-10-CM

## 2021-08-06 HISTORY — PX: FEMUR IM NAIL: SHX1597

## 2021-08-06 HISTORY — PX: ORIF PELVIC FRACTURE: SHX2128

## 2021-08-06 LAB — TYPE AND SCREEN
Antibody Screen: NEGATIVE
Unit division: 0
Unit division: 0

## 2021-08-06 LAB — POCT I-STAT 7, (LYTES, BLD GAS, ICA,H+H)
Acid-Base Excess: 3 mmol/L — ABNORMAL HIGH (ref 0.0–2.0)
Acid-Base Excess: 0 mmol/L (ref 0.0–2.0)
Bicarbonate: 28.1 mmol/L — ABNORMAL HIGH (ref 20.0–28.0)
Bicarbonate: 25.4 mmol/L (ref 20.0–28.0)
Calcium, Ion: 1.12 mmol/L — ABNORMAL LOW (ref 1.15–1.40)
Calcium, Ion: 1.12 mmol/L — ABNORMAL LOW (ref 1.15–1.40)
HCT: 20 % — ABNORMAL LOW (ref 36.0–46.0)
HCT: 27 % — ABNORMAL LOW (ref 36.0–46.0)
Hemoglobin: 6.8 g/dL — CL (ref 12.0–15.0)
Hemoglobin: 9.2 g/dL — ABNORMAL LOW (ref 12.0–15.0)
O2 Saturation: 100 %
O2 Saturation: 100 %
Potassium: 4.2 mmol/L (ref 3.5–5.1)
Potassium: 4.2 mmol/L (ref 3.5–5.1)
Sodium: 137 mmol/L (ref 135–145)
Sodium: 136 mmol/L (ref 135–145)
TCO2: 29 mmol/L (ref 22–32)
TCO2: 27 mmol/L (ref 22–32)
pCO2 arterial: 43.1 mmHg (ref 32–48)
pCO2 arterial: 42.5 mmHg (ref 32–48)
pH, Arterial: 7.422 (ref 7.35–7.45)
pH, Arterial: 7.385 (ref 7.35–7.45)
pO2, Arterial: 229 mmHg — ABNORMAL HIGH (ref 83–108)
pO2, Arterial: 185 mmHg — ABNORMAL HIGH (ref 83–108)

## 2021-08-06 LAB — GLUCOSE, CAPILLARY
Glucose-Capillary: 104 mg/dL — ABNORMAL HIGH (ref 70–99)
Glucose-Capillary: 119 mg/dL — ABNORMAL HIGH (ref 70–99)
Glucose-Capillary: 127 mg/dL — ABNORMAL HIGH (ref 70–99)
Glucose-Capillary: 128 mg/dL — ABNORMAL HIGH (ref 70–99)

## 2021-08-06 LAB — PREPARE RBC (CROSSMATCH)

## 2021-08-06 SURGERY — INSERTION, INTRAMEDULLARY ROD, FEMUR, RETROGRADE
Anesthesia: General | Laterality: Bilateral

## 2021-08-06 MED ORDER — ROCURONIUM BROMIDE 10 MG/ML (PF) SYRINGE
PREFILLED_SYRINGE | INTRAVENOUS | Status: DC | PRN
  Administered 2021-08-06: 20 mg via INTRAVENOUS
  Administered 2021-08-06: 80 mg via INTRAVENOUS
  Administered 2021-08-06: 30 mg via INTRAVENOUS

## 2021-08-06 MED ORDER — VANCOMYCIN HCL 1000 MG IV SOLR
INTRAVENOUS | Status: AC
  Filled 2021-08-06: qty 40

## 2021-08-06 MED ORDER — HYDROMORPHONE HCL 1 MG/ML IJ SOLN
INTRAMUSCULAR | Status: AC
  Filled 2021-08-06: qty 0.5

## 2021-08-06 MED ORDER — LACTATED RINGERS IV SOLN: INTRAVENOUS | Status: DC | PRN

## 2021-08-06 MED ORDER — DEXAMETHASONE SODIUM PHOSPHATE 10 MG/ML IJ SOLN
INTRAMUSCULAR | Status: AC
  Filled 2021-08-06: qty 1

## 2021-08-06 MED ORDER — KETAMINE HCL 50 MG/5ML IJ SOSY
PREFILLED_SYRINGE | INTRAMUSCULAR | Status: AC
  Filled 2021-08-06: qty 5

## 2021-08-06 MED ORDER — SCOPOLAMINE 1 MG/3DAYS TD PT72
MEDICATED_PATCH | TRANSDERMAL | Status: AC
  Filled 2021-08-06: qty 1

## 2021-08-06 MED ORDER — PHENYLEPHRINE 80 MCG/ML (10ML) SYRINGE FOR IV PUSH (FOR BLOOD PRESSURE SUPPORT)
PREFILLED_SYRINGE | INTRAVENOUS | Status: DC | PRN
  Administered 2021-08-06: 160 ug via INTRAVENOUS
  Administered 2021-08-06: 80 ug via INTRAVENOUS
  Administered 2021-08-06: 160 ug via INTRAVENOUS

## 2021-08-06 MED ORDER — LIDOCAINE 2% (20 MG/ML) 5 ML SYRINGE
INTRAMUSCULAR | Status: AC
  Filled 2021-08-06: qty 5

## 2021-08-06 MED ORDER — OXYCODONE HCL 5 MG PO TABS: 5.0000 mg | ORAL_TABLET | Freq: Once | ORAL | Status: DC | PRN

## 2021-08-06 MED ORDER — VASOPRESSIN 20 UNIT/ML IV SOLN
INTRAVENOUS | Status: DC | PRN
  Administered 2021-08-06: 2 [IU] via INTRAVENOUS
  Administered 2021-08-06: 1 [IU] via INTRAVENOUS

## 2021-08-06 MED ORDER — DEXAMETHASONE SODIUM PHOSPHATE 10 MG/ML IJ SOLN
INTRAMUSCULAR | Status: DC | PRN
  Administered 2021-08-06: 10 mg via INTRAVENOUS

## 2021-08-06 MED ORDER — LIDOCAINE 2% (20 MG/ML) 5 ML SYRINGE
INTRAMUSCULAR | Status: DC | PRN
  Administered 2021-08-06: 60 mg via INTRAVENOUS

## 2021-08-06 MED ORDER — SODIUM CHLORIDE 0.9 % IV SOLN: INTRAVENOUS | Status: DC | PRN

## 2021-08-06 MED ORDER — FENTANYL CITRATE (PF) 250 MCG/5ML IJ SOLN
INTRAMUSCULAR | Status: AC
  Filled 2021-08-06: qty 5

## 2021-08-06 MED ORDER — PHENYLEPHRINE 80 MCG/ML (10ML) SYRINGE FOR IV PUSH (FOR BLOOD PRESSURE SUPPORT)
PREFILLED_SYRINGE | INTRAVENOUS | Status: AC
  Filled 2021-08-06: qty 10

## 2021-08-06 MED ORDER — SUCCINYLCHOLINE CHLORIDE 200 MG/10ML IV SOSY
PREFILLED_SYRINGE | INTRAVENOUS | Status: DC | PRN
  Administered 2021-08-06: 120 mg via INTRAVENOUS

## 2021-08-06 MED ORDER — HYDROMORPHONE HCL 1 MG/ML IJ SOLN
0.2500 mg | INTRAMUSCULAR | Status: DC | PRN
  Administered 2021-08-06: 0.5 mg via INTRAVENOUS

## 2021-08-06 MED ORDER — 0.9 % SODIUM CHLORIDE (POUR BTL) OPTIME
TOPICAL | Status: DC | PRN
  Administered 2021-08-06 (×2): 1000 mL

## 2021-08-06 MED ORDER — ROCURONIUM BROMIDE 10 MG/ML (PF) SYRINGE
PREFILLED_SYRINGE | INTRAVENOUS | Status: AC
  Filled 2021-08-06: qty 30

## 2021-08-06 MED ORDER — MIDAZOLAM HCL 2 MG/2ML IJ SOLN
INTRAMUSCULAR | Status: AC
  Filled 2021-08-06: qty 2

## 2021-08-06 MED ORDER — SODIUM CHLORIDE 0.9% IV SOLUTION: Freq: Once | INTRAVENOUS | Status: DC

## 2021-08-06 MED ORDER — PROMETHAZINE HCL 25 MG/ML IJ SOLN: 6.2500 mg | INTRAMUSCULAR | Status: DC | PRN

## 2021-08-06 MED ORDER — METHOCARBAMOL 500 MG PO TABS: 500.0000 mg | ORAL_TABLET | Freq: Four times a day (QID) | ORAL | Status: DC | PRN

## 2021-08-06 MED ORDER — PHENYLEPHRINE HCL-NACL 20-0.9 MG/250ML-% IV SOLN
INTRAVENOUS | Status: DC | PRN
  Administered 2021-08-06: 25 ug/min via INTRAVENOUS

## 2021-08-06 MED ORDER — SODIUM CHLORIDE 0.9 % IV SOLN
2.0000 g | INTRAVENOUS | Status: AC
  Administered 2021-08-07: 2 g via INTRAVENOUS
  Filled 2021-08-06: qty 20

## 2021-08-06 MED ORDER — ONDANSETRON HCL 4 MG/2ML IJ SOLN
INTRAMUSCULAR | Status: DC | PRN
  Administered 2021-08-06: 4 mg via INTRAVENOUS

## 2021-08-06 MED ORDER — ACETAMINOPHEN 10 MG/ML IV SOLN
INTRAVENOUS | Status: AC
  Filled 2021-08-06: qty 100

## 2021-08-06 MED ORDER — ONDANSETRON HCL 4 MG/2ML IJ SOLN
INTRAMUSCULAR | Status: AC
  Filled 2021-08-06: qty 2

## 2021-08-06 MED ORDER — MIDAZOLAM HCL 5 MG/5ML IJ SOLN
INTRAMUSCULAR | Status: DC | PRN
  Administered 2021-08-06: 2 mg via INTRAVENOUS

## 2021-08-06 MED ORDER — HYDROMORPHONE HCL 1 MG/ML IJ SOLN
INTRAMUSCULAR | Status: DC | PRN
  Administered 2021-08-06: .5 mg via INTRAVENOUS

## 2021-08-06 MED ORDER — OXYCODONE HCL 5 MG/5ML PO SOLN: 5.0000 mg | Freq: Once | ORAL | Status: DC | PRN

## 2021-08-06 MED ORDER — METOCLOPRAMIDE HCL 5 MG/ML IJ SOLN
5.0000 mg | Freq: Three times a day (TID) | INTRAMUSCULAR | Status: DC | PRN
  Administered 2021-08-08 – 2021-08-10 (×4): 10 mg via INTRAVENOUS
  Filled 2021-08-06 (×4): qty 2

## 2021-08-06 MED ORDER — FENTANYL CITRATE (PF) 250 MCG/5ML IJ SOLN
INTRAMUSCULAR | Status: DC | PRN
  Administered 2021-08-06 (×5): 50 ug via INTRAVENOUS

## 2021-08-06 MED ORDER — KETAMINE HCL 10 MG/ML IJ SOLN
INTRAMUSCULAR | Status: DC | PRN
  Administered 2021-08-06 (×2): 10 mg via INTRAVENOUS
  Administered 2021-08-06: 30 mg via INTRAVENOUS

## 2021-08-06 MED ORDER — PROPOFOL 10 MG/ML IV BOLUS
INTRAVENOUS | Status: DC | PRN
  Administered 2021-08-06: 100 mg via INTRAVENOUS

## 2021-08-06 MED ORDER — POLYETHYLENE GLYCOL 3350 17 G PO PACK
17.0000 g | PACK | Freq: Every day | ORAL | Status: DC | PRN
  Administered 2021-08-09: 17 g via ORAL
  Filled 2021-08-06: qty 1

## 2021-08-06 MED ORDER — METOCLOPRAMIDE HCL 5 MG PO TABS: 5.0000 mg | ORAL_TABLET | Freq: Three times a day (TID) | ORAL | Status: DC | PRN

## 2021-08-06 MED ORDER — FENTANYL CITRATE (PF) 100 MCG/2ML IJ SOLN
25.0000 ug | INTRAMUSCULAR | Status: DC | PRN
  Administered 2021-08-06 (×3): 50 ug via INTRAVENOUS

## 2021-08-06 MED ORDER — FENTANYL CITRATE (PF) 100 MCG/2ML IJ SOLN
INTRAMUSCULAR | Status: AC
  Filled 2021-08-06: qty 4

## 2021-08-06 MED ORDER — ACETAMINOPHEN 10 MG/ML IV SOLN
1000.0000 mg | Freq: Once | INTRAVENOUS | Status: DC | PRN
  Administered 2021-08-06: 1000 mg via INTRAVENOUS

## 2021-08-06 MED ORDER — ALBUMIN HUMAN 5 % IV SOLN: INTRAVENOUS | Status: DC | PRN

## 2021-08-06 MED ORDER — AMISULPRIDE (ANTIEMETIC) 5 MG/2ML IV SOLN: 10.0000 mg | Freq: Once | INTRAVENOUS | Status: DC | PRN

## 2021-08-06 MED ORDER — HYDROMORPHONE HCL 1 MG/ML IJ SOLN
INTRAMUSCULAR | Status: AC
  Filled 2021-08-06: qty 1

## 2021-08-06 MED ORDER — SUGAMMADEX SODIUM 200 MG/2ML IV SOLN
INTRAVENOUS | Status: DC | PRN
  Administered 2021-08-06: 200 mg via INTRAVENOUS
  Administered 2021-08-06: 100 mg via INTRAVENOUS

## 2021-08-06 MED ORDER — PROPOFOL 10 MG/ML IV BOLUS
INTRAVENOUS | Status: AC
  Filled 2021-08-06: qty 20

## 2021-08-06 MED ORDER — SCOPOLAMINE 1 MG/3DAYS TD PT72
MEDICATED_PATCH | TRANSDERMAL | Status: DC | PRN
  Administered 2021-08-06: 1 via TRANSDERMAL

## 2021-08-06 MED ORDER — METHOCARBAMOL 1000 MG/10ML IJ SOLN
500.0000 mg | Freq: Four times a day (QID) | INTRAVENOUS | Status: DC | PRN
  Filled 2021-08-06: qty 5

## 2021-08-06 MED ORDER — VASOPRESSIN 20 UNIT/ML IV SOLN
INTRAVENOUS | Status: AC
  Filled 2021-08-06: qty 1

## 2021-08-06 SURGICAL SUPPLY — 113 items
BAG COUNTER SPONGE SURGICOUNT (BAG) ×3 IMPLANT
BIT DRILL 4.2 (DRILL) ×1 IMPLANT
BIT DRILL CALIBRATED 4.2 (BIT) ×1 IMPLANT
BIT DRILL CANN 4.5MM (BIT) ×1 IMPLANT
BIT DRILL CANN QC 11.2 LG (BIT) ×2 IMPLANT
BIT DRILL FLUTED 2.5 (BIT) ×1 IMPLANT
BIT DRILL SHORT 4.2 (BIT) ×1 IMPLANT
BLADE CLIPPER SURG (BLADE) IMPLANT
BNDG COHESIVE 4X5 TAN STRL (GAUZE/BANDAGES/DRESSINGS) ×3 IMPLANT
BNDG ELASTIC 4X5.8 VLCR STR LF (GAUZE/BANDAGES/DRESSINGS) ×7 IMPLANT
BNDG ELASTIC 6X10 VLCR STRL LF (GAUZE/BANDAGES/DRESSINGS) ×4 IMPLANT
BNDG ELASTIC 6X5.8 VLCR STR LF (GAUZE/BANDAGES/DRESSINGS) ×3 IMPLANT
BNDG GAUZE ELAST 4 BULKY (GAUZE/BANDAGES/DRESSINGS) ×6 IMPLANT
BRUSH SCRUB EZ PLAIN DRY (MISCELLANEOUS) ×6 IMPLANT
CHLORAPREP W/TINT 26 (MISCELLANEOUS) ×3 IMPLANT
COVER MAYO STAND STRL (DRAPES) ×3 IMPLANT
COVER SURGICAL LIGHT HANDLE (MISCELLANEOUS) ×6 IMPLANT
DERMABOND ADVANCED (GAUZE/BANDAGES/DRESSINGS) ×1
DERMABOND ADVANCED .7 DNX12 (GAUZE/BANDAGES/DRESSINGS) ×3 IMPLANT
DRAIN CHANNEL 15F RND FF W/TCR (WOUND CARE) IMPLANT
DRAPE C-ARM 42X72 X-RAY (DRAPES) ×5 IMPLANT
DRAPE C-ARMOR (DRAPES) ×3 IMPLANT
DRAPE HALF SHEET 40X57 (DRAPES) ×6 IMPLANT
DRAPE IMP U-DRAPE 54X76 (DRAPES) ×6 IMPLANT
DRAPE INCISE IOBAN 66X45 STRL (DRAPES) ×6 IMPLANT
DRAPE ORTHO SPLIT 77X108 STRL (DRAPES) ×2
DRAPE SURG 17X23 STRL (DRAPES) ×14 IMPLANT
DRAPE SURG ORHT 6 SPLT 77X108 (DRAPES) ×4 IMPLANT
DRAPE U-SHAPE 47X51 STRL (DRAPES) ×3 IMPLANT
DRESSING MEPILEX FLEX 4X4 (GAUZE/BANDAGES/DRESSINGS) ×9 IMPLANT
DRILL 4.2 (DRILL) ×3
DRILL BIT CALIBRATED 4.2 (BIT) ×3
DRILL BIT CANN 4.5MM (BIT) ×1
DRILL BIT FLUTED 2.5 (BIT) ×3
DRILL BIT SHORT 4.2 (BIT) ×1
DRSG ADAPTIC 3X8 NADH LF (GAUZE/BANDAGES/DRESSINGS) ×3 IMPLANT
DRSG MEPILEX BORDER 4X4 (GAUZE/BANDAGES/DRESSINGS) ×6 IMPLANT
DRSG MEPILEX BORDER 4X8 (GAUZE/BANDAGES/DRESSINGS) ×10 IMPLANT
DRSG MEPILEX FLEX 4X4 (GAUZE/BANDAGES/DRESSINGS) ×27
DRSG MEPITEL 4X7.2 (GAUZE/BANDAGES/DRESSINGS) ×2 IMPLANT
ELECT REM PT RETURN 9FT ADLT (ELECTROSURGICAL) ×3
ELECTRODE REM PT RTRN 9FT ADLT (ELECTROSURGICAL) ×2 IMPLANT
EVACUATOR 1/8 PVC DRAIN (DRAIN) IMPLANT
EVACUATOR SILICONE 100CC (DRAIN) IMPLANT
GAUZE SPONGE 4X4 12PLY STRL (GAUZE/BANDAGES/DRESSINGS) ×3 IMPLANT
GLOVE BIO SURGEON STRL SZ 6.5 (GLOVE) ×9 IMPLANT
GLOVE BIO SURGEON STRL SZ7.5 (GLOVE) ×12 IMPLANT
GLOVE BIOGEL PI IND STRL 6.5 (GLOVE) ×2 IMPLANT
GLOVE BIOGEL PI IND STRL 7.5 (GLOVE) ×2 IMPLANT
GLOVE BIOGEL PI INDICATOR 6.5 (GLOVE) ×1
GLOVE BIOGEL PI INDICATOR 7.5 (GLOVE) ×1
GOWN STRL REUS W/ TWL LRG LVL3 (GOWN DISPOSABLE) ×4 IMPLANT
GOWN STRL REUS W/TWL LRG LVL3 (GOWN DISPOSABLE) ×2
GUIDEWIRE 2.0MM (WIRE) ×4 IMPLANT
GUIDEWIRE 3.2X400 (WIRE) ×4 IMPLANT
GUIDEWIRE THREADED 2.8MM (WIRE) ×2 IMPLANT
HANDPIECE INTERPULSE COAX TIP (DISPOSABLE)
KIT BASIN OR (CUSTOM PROCEDURE TRAY) ×3 IMPLANT
KIT TURNOVER KIT B (KITS) ×3 IMPLANT
MANIFOLD NEPTUNE II (INSTRUMENTS) ×3 IMPLANT
NAIL RFNA BEND 9X360 5D (Nail) ×4 IMPLANT
NS IRRIG 1000ML POUR BTL (IV SOLUTION) ×6 IMPLANT
PACK ORTHO EXTREMITY (CUSTOM PROCEDURE TRAY) ×3 IMPLANT
PACK TOTAL JOINT (CUSTOM PROCEDURE TRAY) ×3 IMPLANT
PACK UNIVERSAL I (CUSTOM PROCEDURE TRAY) ×3 IMPLANT
PAD ARMBOARD 7.5X6 YLW CONV (MISCELLANEOUS) ×6 IMPLANT
PADDING CAST ABS 4INX4YD NS (CAST SUPPLIES) ×2
PADDING CAST ABS COTTON 4X4 ST (CAST SUPPLIES) ×2 IMPLANT
PADDING CAST COTTON 6X4 STRL (CAST SUPPLIES) ×3 IMPLANT
PLATE PUBLIC SYMPHOSIS 3.5 (Plate) ×2 IMPLANT
REAMER ROD 3.8 BALL TIP 3X950 (ORTHOPEDIC DISPOSABLE SUPPLIES) ×2 IMPLANT
SCREW BONE CANN 6.5X140 (Screw) ×2 IMPLANT
SCREW CORTEX 3.5 22MM (Screw) ×1 IMPLANT
SCREW CORTEX 3.5 26MM (Screw) ×1 IMPLANT
SCREW CORTEX 3.5 30MM (Screw) ×2 IMPLANT
SCREW CORTEX 3.5X40MM (Screw) ×2 IMPLANT
SCREW CORTEX 3.5X45MM (Screw) ×2 IMPLANT
SCREW LOCK CORT ST 3.5X22 (Screw) ×1 IMPLANT
SCREW LOCK CORT ST 3.5X26 (Screw) ×1 IMPLANT
SCREW LOCK CORT ST 3.5X30 (Screw) ×2 IMPLANT
SCREW LOCK IM 5X36 (Screw) ×1 IMPLANT
SCREW LOCK IM NAIL 5X48 (Screw) ×2 IMPLANT
SCREW LOCK IM NAIL 5X50 (Screw) ×2 IMPLANT
SCREW LOCK IM NAIL 5X66 (Screw) ×2 IMPLANT
SCREW LOCK IM NAIL 5X70 (Screw) ×2 IMPLANT
SCREW LOCK IM NL 5X38 (Screw) ×2 IMPLANT
SCREW LOCK IM TI 5X42 (Screw) ×4 IMPLANT
SCREW LOCK X25 36X5X IM (Screw) ×1 IMPLANT
SET HNDPC FAN SPRY TIP SCT (DISPOSABLE) IMPLANT
SPONGE T-LAP 18X18 ~~LOC~~+RFID (SPONGE) ×3 IMPLANT
STAPLER VISISTAT 35W (STAPLE) ×3 IMPLANT
SUCTION FRAZIER HANDLE 10FR (MISCELLANEOUS) ×1
SUCTION TUBE FRAZIER 10FR DISP (MISCELLANEOUS) ×2 IMPLANT
SUT ETHILON 2 0 FS 18 (SUTURE) ×6 IMPLANT
SUT ETHILON 3 0 PS 1 (SUTURE) ×10 IMPLANT
SUT MNCRL AB 3-0 PS2 18 (SUTURE) ×3 IMPLANT
SUT MON AB 2-0 CT1 36 (SUTURE) ×3 IMPLANT
SUT PDS AB 0 CT 36 (SUTURE) IMPLANT
SUT VIC AB 0 CT1 27 (SUTURE) ×3
SUT VIC AB 0 CT1 27XBRD ANBCTR (SUTURE) ×5 IMPLANT
SUT VIC AB 1 CT1 18XCR BRD 8 (SUTURE) IMPLANT
SUT VIC AB 1 CT1 8-18 (SUTURE)
SUT VIC AB 2-0 CT1 27 (SUTURE) ×3
SUT VIC AB 2-0 CT1 TAPERPNT 27 (SUTURE) ×5 IMPLANT
SWAB CULTURE ESWAB REG 1ML (MISCELLANEOUS) IMPLANT
TOWEL GREEN STERILE (TOWEL DISPOSABLE) ×6 IMPLANT
TOWEL GREEN STERILE FF (TOWEL DISPOSABLE) ×3 IMPLANT
TRAY FOLEY MTR SLVR 16FR STAT (SET/KITS/TRAYS/PACK) IMPLANT
TUBE CONNECTING 12X1/4 (SUCTIONS) ×3 IMPLANT
UNDERPAD 30X36 HEAVY ABSORB (UNDERPADS AND DIAPERS) ×3 IMPLANT
WASHER FOR 5.0 SCREWS (Washer) ×2 IMPLANT
WATER STERILE IRR 1000ML POUR (IV SOLUTION) ×12 IMPLANT
YANKAUER SUCT BULB TIP NO VENT (SUCTIONS) ×5 IMPLANT

## 2021-08-06 NOTE — Anesthesia Procedure Notes (Signed)
Central Venous Catheter Insertion Performed by: Leonides Grills, MD, anesthesiologist Start/End7/04/2021 8:00 AM, 08/06/2021 8:15 AM Patient location: OR. Preanesthetic checklist: patient identified, IV checked, site marked, risks and benefits discussed, surgical consent, monitors and equipment checked, pre-op evaluation, timeout performed and anesthesia consent Position: supine Patient sedated Hand hygiene performed , maximum sterile barriers used  and Seldinger technique used Catheter size: 8 Fr Total catheter length 16. Central line was placed.Double lumen Procedure performed using ultrasound guided technique. Ultrasound Notes:anatomy identified, needle tip was noted to be adjacent to the nerve/plexus identified, no ultrasound evidence of intravascular and/or intraneural injection and image(s) printed for medical record Attempts: 1 Following insertion, dressing applied, line sutured and Biopatch. Post procedure assessment: blood return through all ports  Patient tolerated the procedure well with no immediate complications.

## 2021-08-06 NOTE — Anesthesia Procedure Notes (Signed)
Arterial Line Insertion Start/End7/04/2021 7:56 AM, 08/06/2021 7:57 AM Performed by: Lonia Mad, CRNA, CRNA  Patient location: OR. Preanesthetic checklist: patient identified, IV checked, site marked, risks and benefits discussed, surgical consent, monitors and equipment checked, pre-op evaluation, timeout performed and anesthesia consent Right, radial was placed Catheter size: 20 G Hand hygiene performed , maximum sterile barriers used  and Seldinger technique used Allen's test indicative of satisfactory collateral circulation Attempts: 1 Procedure performed without using ultrasound guided technique. Following insertion, dressing applied and Biopatch. Post procedure assessment: normal and unchanged  Patient tolerated the procedure well with no immediate complications.

## 2021-08-06 NOTE — Transfer of Care (Signed)
Immediate Anesthesia Transfer of Care Note  Patient: Shelley [name]  Procedure(s) Performed: INTRAMEDULLARY (IM) RETROGRADE FEMORAL NAILING (Bilateral) OPEN REDUCTION INTERNAL FIXATION (ORIF) PELVIC FRACTURE (Bilateral)  Patient Location: PACU  Anesthesia Type:General  Level of Consciousness: drowsy and patient cooperative  Airway & Oxygen Therapy: Patient Spontanous Breathing and Patient connected to nasal cannula oxygen  Post-op Assessment: Report given to RN and Post -op Vital signs reviewed and stable  Post vital signs: Reviewed and stable  Last Vitals:  Vitals Value Taken Time  BP 128/84 08/06/21 1149  Temp    Pulse 108 08/06/21 1155  Resp 16 08/06/21 1155  SpO2 89 % 08/06/21 1155  Vitals shown include unvalidated device data.  Last Pain:  Vitals:   08/06/21 0600  TempSrc:   PainSc: 6       Patients Stated Pain Goal: 0 (08/05/21 2000)  Complications: No notable events documented.
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
## 2021-08-06 NOTE — Progress Notes (Signed)
OT Cancellation Note  Patient Details Name: Shelley [name] MRN: [id number] DOB: 1985-08-12   Cancelled Treatment:    Reason Eval/Treat Not Completed: Patient at procedure or test/ unavailable (Pt in OR; OT evaluation to f/u after surgery)  [name] [name] 08/06/2021, 7:30 AM
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
## 2021-08-06 NOTE — Anesthesia Postprocedure Evaluation (Signed)
Anesthesia Post Note  Patient: Shelley [name]  Procedure(s) Performed: INTRAMEDULLARY (IM) RETROGRADE FEMORAL NAILING (Bilateral) OPEN REDUCTION INTERNAL FIXATION (ORIF) PELVIC FRACTURE (Bilateral)     Patient location during evaluation: PACU Anesthesia Type: General Level of consciousness: awake Pain management: pain level controlled Vital Signs Assessment: post-procedure vital signs reviewed and stable Respiratory status: spontaneous breathing, nonlabored ventilation, respiratory function stable and patient connected to nasal cannula oxygen Cardiovascular status: blood pressure returned to baseline and stable Postop Assessment: no apparent nausea or vomiting Anesthetic complications: no   No notable events documented.  Last Vitals:  Vitals:   08/06/21 1500 08/06/21 1600  BP: 128/88 (!) 131/92  Pulse: 94 (!) 104  Resp: 15 (!) 26  Temp:  36.7 C  SpO2: 97% 97%    Last Pain:  Vitals:   08/06/21 1600  TempSrc: Oral  PainSc:                  Rodrecus Belsky P Katria Botts

## 2021-08-06 NOTE — Anesthesia Procedure Notes (Signed)
Procedure Name: Intubation Date/Time: 08/06/2021 7:52 AM  Performed by: Waynard Edwards, CRNAPre-anesthesia Checklist: Patient identified, Emergency Drugs available, Suction available and Patient being monitored Patient Re-evaluated:Patient Re-evaluated prior to induction Oxygen Delivery Method: Circle system utilized Preoxygenation: Pre-oxygenation with 100% oxygen Induction Type: IV induction and Rapid sequence Laryngoscope Size: Glidescope and 3 Grade View: Grade I Tube type: Oral Tube size: 7.0 mm Number of attempts: 1 Airway Equipment and Method: Rigid stylet and Video-laryngoscopy Placement Confirmation: ETT inserted through vocal cords under direct vision, positive ETCO2 and breath sounds checked- equal and bilateral Secured at: 22 cm Tube secured with: Tape Dental Injury: Teeth and Oropharynx as per pre-operative assessment  Comments: Elective glidescope intubation.

## 2021-08-07 ENCOUNTER — Encounter (HOSPITAL_COMMUNITY): Payer: Self-pay | Admitting: Student

## 2021-08-07 LAB — BASIC METABOLIC PANEL
Anion gap: 8 (ref 5–15)
BUN: 10 mg/dL (ref 6–20)
CO2: 27 mmol/L (ref 22–32)
Calcium: 8.9 mg/dL (ref 8.9–10.3)
Chloride: 104 mmol/L (ref 98–111)
Creatinine, Ser: 0.8 mg/dL (ref 0.44–1.00)
GFR, Estimated: >60 mL/min (ref >60)
Glucose, Bld: 98 mg/dL (ref 70–99)
Potassium: 3.6 mmol/L (ref 3.5–5.1)
Sodium: 139 mmol/L (ref 135–145)

## 2021-08-07 LAB — TYPE AND SCREEN
ABO/RH(D): O POS
Unit division: 0
Unit division: 0
Unit division: 0
Unit division: 0

## 2021-08-07 LAB — BPAM RBC
Blood Product Expiration Date: 202307152359
Blood Product Expiration Date: 202307212359
Blood Product Expiration Date: 202307262359
Blood Product Expiration Date: 202307282359
Blood Product Expiration Date: 202307302359
Blood Product Expiration Date: 202308012359
ISSUE DATE / TIME: 202307020024
ISSUE DATE / TIME: 202307020024
ISSUE DATE / TIME: 202307030723
ISSUE DATE / TIME: 202307030723
ISSUE DATE / TIME: 202307030909
ISSUE DATE / TIME: 202307030909
Unit Type and Rh: 5100
Unit Type and Rh: 5100
Unit Type and Rh: 5100
Unit Type and Rh: 5100
Unit Type and Rh: 9500
Unit Type and Rh: 9500

## 2021-08-07 LAB — HEPATIC FUNCTION PANEL
ALT: 115 U/L — ABNORMAL HIGH (ref 0–44)
AST: 228 U/L — ABNORMAL HIGH (ref 15–41)
Albumin: 3.1 g/dL — ABNORMAL LOW (ref 3.5–5.0)
Alkaline Phosphatase: 29 U/L — ABNORMAL LOW (ref 38–126)
Bilirubin, Direct: 0.2 mg/dL (ref 0.0–0.2)
Indirect Bilirubin: 0.9 mg/dL (ref 0.3–0.9)
Total Bilirubin: 1.1 mg/dL (ref 0.3–1.2)
Total Protein: 5.4 g/dL — ABNORMAL LOW (ref 6.5–8.1)

## 2021-08-07 LAB — BLOOD PRODUCT ORDER (VERBAL) VERIFICATION

## 2021-08-07 LAB — CBC
HCT: 29.4 % — ABNORMAL LOW (ref 36.0–46.0)
Hemoglobin: 10.4 g/dL — ABNORMAL LOW (ref 12.0–15.0)
MCH: 30.6 pg (ref 26.0–34.0)
MCHC: 35.4 g/dL (ref 30.0–36.0)
MCV: 86.5 fL (ref 80.0–100.0)
Platelets: 96 10*3/uL — ABNORMAL LOW (ref 150–400)
RBC: 3.4 MIL/uL — ABNORMAL LOW (ref 3.87–5.11)
RDW: 13.9 % (ref 11.5–15.5)
WBC: 9.8 10*3/uL (ref 4.0–10.5)
nRBC: 0 % (ref 0.0–0.2)

## 2021-08-07 LAB — GLUCOSE, CAPILLARY
Glucose-Capillary: 101 mg/dL — ABNORMAL HIGH (ref 70–99)
Glucose-Capillary: 90 mg/dL (ref 70–99)
Glucose-Capillary: 91 mg/dL (ref 70–99)
Glucose-Capillary: 99 mg/dL (ref 70–99)
Glucose-Capillary: 99 mg/dL (ref 70–99)

## 2021-08-07 MED ORDER — CALCIUM CARBONATE ANTACID 500 MG PO CHEW
1.0000 | CHEWABLE_TABLET | Freq: Three times a day (TID) | ORAL | Status: DC
  Administered 2021-08-07 – 2021-08-09 (×7): 200 mg via ORAL
  Filled 2021-08-07 (×7): qty 1

## 2021-08-07 MED ORDER — CALCIUM CARBONATE ANTACID 500 MG PO CHEW
1.0000 | CHEWABLE_TABLET | Freq: Two times a day (BID) | ORAL | Status: DC | PRN
  Administered 2021-08-09: 200 mg via ORAL
  Filled 2021-08-07: qty 1

## 2021-08-07 MED ORDER — ENOXAPARIN SODIUM 30 MG/0.3ML IJ SOSY
30.0000 mg | PREFILLED_SYRINGE | Freq: Two times a day (BID) | INTRAMUSCULAR | Status: DC
  Administered 2021-08-07 (×2): 30 mg via SUBCUTANEOUS
  Filled 2021-08-07 (×2): qty 0.3
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
## 2021-08-07 NOTE — Progress Notes (Addendum)
Inpatient Rehab Admissions Coordinator:   Per therapy recommendations pt was screened for CIR appropriateness by Estill Dooms, PT, DPT.  Pt appears to be a candidate; do note plans for further surgical intervention as early as tomorrow.  Will place an order for consult, per our protocol, and I will f/u after therapy has seen the pt post op.    Estill Dooms, PT, DPT Admissions Coordinator [phone] 08/07/21  12:22 PM
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
## 2021-08-08 ENCOUNTER — Inpatient Hospital Stay (HOSPITAL_COMMUNITY): Payer: Medicaid Other

## 2021-08-08 ENCOUNTER — Inpatient Hospital Stay (HOSPITAL_COMMUNITY): Payer: Medicaid Other | Admitting: Anesthesiology

## 2021-08-08 ENCOUNTER — Other Ambulatory Visit: Payer: Self-pay

## 2021-08-08 ENCOUNTER — Encounter (HOSPITAL_COMMUNITY): Admission: EM | Disposition: A | Discharge status: Rehabilitation Facility | Payer: Self-pay | Source: Home / Self Care

## 2021-08-08 ENCOUNTER — Encounter (HOSPITAL_COMMUNITY): Payer: Self-pay | Admitting: Orthopedic Surgery

## 2021-08-08 DIAGNOSIS — S92111B Displaced fracture of neck of right talus, initial encounter for open fracture: Secondary | ICD-10-CM

## 2021-08-08 DIAGNOSIS — S92251B Displaced fracture of navicular [scaphoid] of right foot, initial encounter for open fracture: Secondary | ICD-10-CM

## 2021-08-08 DIAGNOSIS — S92001B Unspecified fracture of right calcaneus, initial encounter for open fracture: Secondary | ICD-10-CM | POA: Diagnosis not present

## 2021-08-08 HISTORY — PX: EXTERNAL FIXATION REMOVAL: SHX5040

## 2021-08-08 HISTORY — PX: ORIF CALCANEOUS FRACTURE: SHX5030

## 2021-08-08 LAB — GLUCOSE, CAPILLARY
Glucose-Capillary: 111 mg/dL — ABNORMAL HIGH (ref 70–99)
Glucose-Capillary: 135 mg/dL — ABNORMAL HIGH (ref 70–99)
Glucose-Capillary: 88 mg/dL (ref 70–99)
Glucose-Capillary: 91 mg/dL (ref 70–99)

## 2021-08-08 LAB — BASIC METABOLIC PANEL
Anion gap: 12 (ref 5–15)
BUN: 11 mg/dL (ref 6–20)
CO2: 24 mmol/L (ref 22–32)
Calcium: 8 mg/dL — ABNORMAL LOW (ref 8.9–10.3)
Chloride: 100 mmol/L (ref 98–111)
Creatinine, Ser: 0.78 mg/dL (ref 0.44–1.00)
GFR, Estimated: >60 mL/min (ref >60)
Glucose, Bld: 83 mg/dL (ref 70–99)
Potassium: 3.2 mmol/L — ABNORMAL LOW (ref 3.5–5.1)
Sodium: 136 mmol/L (ref 135–145)

## 2021-08-08 LAB — CBC
HCT: 26.1 % — ABNORMAL LOW (ref 36.0–46.0)
Hemoglobin: 9.2 g/dL — ABNORMAL LOW (ref 12.0–15.0)
MCH: 30.7 pg (ref 26.0–34.0)
MCHC: 35.2 g/dL (ref 30.0–36.0)
MCV: 87 fL (ref 80.0–100.0)
Platelets: 109 10*3/uL — ABNORMAL LOW (ref 150–400)
RBC: 3 MIL/uL — ABNORMAL LOW (ref 3.87–5.11)
RDW: 13.7 % (ref 11.5–15.5)
WBC: 7.8 10*3/uL (ref 4.0–10.5)
nRBC: 0 % (ref 0.0–0.2)

## 2021-08-08 LAB — VITAMIN D 25 HYDROXY (VIT D DEFICIENCY, FRACTURES): Vit D, 25-Hydroxy: 10.12 ng/mL — ABNORMAL LOW (ref 30–100)

## 2021-08-08 SURGERY — OPEN REDUCTION INTERNAL FIXATION (ORIF) CALCANEOUS FRACTURE
Anesthesia: Regional | Laterality: Right

## 2021-08-08 MED ORDER — 0.9 % SODIUM CHLORIDE (POUR BTL) OPTIME
TOPICAL | Status: DC | PRN
  Administered 2021-08-08: 1000 mL

## 2021-08-08 MED ORDER — BISACODYL 10 MG RE SUPP
10.0000 mg | Freq: Every day | RECTAL | Status: DC
  Administered 2021-08-11 – 2021-08-14 (×3): 10 mg via RECTAL
  Filled 2021-08-08 (×10): qty 1

## 2021-08-08 MED ORDER — CEFAZOLIN SODIUM 1 G IJ SOLR
INTRAMUSCULAR | Status: AC
  Filled 2021-08-08: qty 40

## 2021-08-08 MED ORDER — SUCCINYLCHOLINE CHLORIDE 200 MG/10ML IV SOSY
PREFILLED_SYRINGE | INTRAVENOUS | Status: DC | PRN
  Administered 2021-08-08: 100 mg via INTRAVENOUS

## 2021-08-08 MED ORDER — CHLORHEXIDINE GLUCONATE 0.12 % MT SOLN
OROMUCOSAL | Status: AC
  Filled 2021-08-08: qty 15

## 2021-08-08 MED ORDER — ONDANSETRON HCL 4 MG/2ML IJ SOLN
INTRAMUSCULAR | Status: AC
  Filled 2021-08-08: qty 2

## 2021-08-08 MED ORDER — DEXAMETHASONE SODIUM PHOSPHATE 10 MG/ML IJ SOLN
INTRAMUSCULAR | Status: DC | PRN
  Administered 2021-08-08: 10 mg via INTRAVENOUS

## 2021-08-08 MED ORDER — BETHANECHOL CHLORIDE 10 MG PO TABS
10.0000 mg | ORAL_TABLET | Freq: Three times a day (TID) | ORAL | Status: DC
  Administered 2021-08-08 – 2021-08-09 (×4): 10 mg via ORAL
  Filled 2021-08-08 (×5): qty 1

## 2021-08-08 MED ORDER — SUGAMMADEX SODIUM 200 MG/2ML IV SOLN
INTRAVENOUS | Status: DC | PRN
  Administered 2021-08-08: 200 mg via INTRAVENOUS

## 2021-08-08 MED ORDER — CEFAZOLIN SODIUM-DEXTROSE 2-3 GM-%(50ML) IV SOLR
INTRAVENOUS | Status: DC | PRN
  Administered 2021-08-08: 2 g via INTRAVENOUS

## 2021-08-08 MED ORDER — ORAL CARE MOUTH RINSE: 15.0000 mL | Freq: Once | OROMUCOSAL | Status: DC

## 2021-08-08 MED ORDER — ENOXAPARIN SODIUM 30 MG/0.3ML IJ SOSY
30.0000 mg | PREFILLED_SYRINGE | Freq: Two times a day (BID) | INTRAMUSCULAR | Status: DC
  Administered 2021-08-09 – 2021-08-28 (×38): 30 mg via SUBCUTANEOUS
  Filled 2021-08-08 (×38): qty 0.3

## 2021-08-08 MED ORDER — PROPOFOL 10 MG/ML IV BOLUS
INTRAVENOUS | Status: DC | PRN
  Administered 2021-08-08: 150 mg via INTRAVENOUS

## 2021-08-08 MED ORDER — FENTANYL CITRATE (PF) 250 MCG/5ML IJ SOLN
INTRAMUSCULAR | Status: AC
  Filled 2021-08-08: qty 5

## 2021-08-08 MED ORDER — ACETAMINOPHEN 10 MG/ML IV SOLN: 1000.0000 mg | Freq: Once | INTRAVENOUS | Status: DC | PRN

## 2021-08-08 MED ORDER — FENTANYL CITRATE (PF) 100 MCG/2ML IJ SOLN: 25.0000 ug | INTRAMUSCULAR | Status: DC | PRN

## 2021-08-08 MED ORDER — PHENYLEPHRINE HCL-NACL 20-0.9 MG/250ML-% IV SOLN
INTRAVENOUS | Status: DC | PRN
  Administered 2021-08-08: 30 ug/min via INTRAVENOUS

## 2021-08-08 MED ORDER — PHENYLEPHRINE 80 MCG/ML (10ML) SYRINGE FOR IV PUSH (FOR BLOOD PRESSURE SUPPORT)
PREFILLED_SYRINGE | INTRAVENOUS | Status: AC
  Filled 2021-08-08: qty 10

## 2021-08-08 MED ORDER — SODIUM CHLORIDE 0.9 % IR SOLN
Status: DC | PRN
  Administered 2021-08-08: 3000 mL

## 2021-08-08 MED ORDER — FENTANYL CITRATE (PF) 100 MCG/2ML IJ SOLN
INTRAMUSCULAR | Status: AC
  Administered 2021-08-08: 100 ug via INTRAVENOUS
  Filled 2021-08-08: qty 2

## 2021-08-08 MED ORDER — ROPIVACAINE HCL 5 MG/ML IJ SOLN
INTRAMUSCULAR | Status: DC | PRN
  Administered 2021-08-08: 40 mL via PERINEURAL

## 2021-08-08 MED ORDER — ROCURONIUM BROMIDE 10 MG/ML (PF) SYRINGE
PREFILLED_SYRINGE | INTRAVENOUS | Status: AC
  Filled 2021-08-08: qty 10

## 2021-08-08 MED ORDER — CHLORHEXIDINE GLUCONATE 0.12 % MT SOLN
15.0000 mL | Freq: Once | OROMUCOSAL | Status: DC
  Administered 2021-08-08: 15 mL via OROMUCOSAL

## 2021-08-08 MED ORDER — MIDAZOLAM HCL 2 MG/2ML IJ SOLN: 2.0000 mg | Freq: Once | INTRAMUSCULAR | Status: AC

## 2021-08-08 MED ORDER — PHENYLEPHRINE 80 MCG/ML (10ML) SYRINGE FOR IV PUSH (FOR BLOOD PRESSURE SUPPORT)
PREFILLED_SYRINGE | INTRAVENOUS | Status: DC | PRN
  Administered 2021-08-08 (×3): 80 ug via INTRAVENOUS

## 2021-08-08 MED ORDER — ROCURONIUM BROMIDE 10 MG/ML (PF) SYRINGE
PREFILLED_SYRINGE | INTRAVENOUS | Status: DC | PRN
  Administered 2021-08-08 (×2): 30 mg via INTRAVENOUS
  Administered 2021-08-08: 20 mg via INTRAVENOUS
  Administered 2021-08-08: 50 mg via INTRAVENOUS

## 2021-08-08 MED ORDER — ALBUMIN HUMAN 5 % IV SOLN: INTRAVENOUS | Status: DC | PRN

## 2021-08-08 MED ORDER — LIDOCAINE 2% (20 MG/ML) 5 ML SYRINGE
INTRAMUSCULAR | Status: DC | PRN
  Administered 2021-08-08: 40 mg via INTRAVENOUS

## 2021-08-08 MED ORDER — FENTANYL CITRATE (PF) 100 MCG/2ML IJ SOLN: 100.0000 ug | Freq: Once | INTRAMUSCULAR | Status: AC

## 2021-08-08 MED ORDER — AMISULPRIDE (ANTIEMETIC) 5 MG/2ML IV SOLN
INTRAVENOUS | Status: AC
  Filled 2021-08-08: qty 4

## 2021-08-08 MED ORDER — LACTATED RINGERS IV SOLN
INTRAVENOUS | Status: DC
Start: 2021-08-08 — End: 2021-08-08

## 2021-08-08 MED ORDER — POTASSIUM CHLORIDE 20 MEQ PO PACK
40.0000 meq | PACK | Freq: Two times a day (BID) | ORAL | Status: AC
  Administered 2021-08-08 – 2021-08-09 (×2): 40 meq via ORAL
  Filled 2021-08-08 (×3): qty 2

## 2021-08-08 MED ORDER — DEXAMETHASONE SODIUM PHOSPHATE 10 MG/ML IJ SOLN
INTRAMUSCULAR | Status: AC
  Filled 2021-08-08: qty 1

## 2021-08-08 MED ORDER — MIDAZOLAM HCL 2 MG/2ML IJ SOLN
INTRAMUSCULAR | Status: AC
  Administered 2021-08-08: 2 mg via INTRAVENOUS
  Filled 2021-08-08: qty 2

## 2021-08-08 MED ORDER — CEFAZOLIN SODIUM-DEXTROSE 2-4 GM/100ML-% IV SOLN
2.0000 g | Freq: Three times a day (TID) | INTRAVENOUS | Status: AC
  Administered 2021-08-08 – 2021-08-09 (×3): 2 g via INTRAVENOUS
  Filled 2021-08-08 (×3): qty 100

## 2021-08-08 MED ORDER — LIDOCAINE 2% (20 MG/ML) 5 ML SYRINGE
INTRAMUSCULAR | Status: AC
  Filled 2021-08-08: qty 5

## 2021-08-08 MED ORDER — FENTANYL CITRATE (PF) 250 MCG/5ML IJ SOLN
INTRAMUSCULAR | Status: DC | PRN
Start: 2021-08-08 — End: 2021-08-08
  Administered 2021-08-08: 100 ug via INTRAVENOUS

## 2021-08-08 MED ORDER — PROPOFOL 10 MG/ML IV BOLUS
INTRAVENOUS | Status: AC
  Filled 2021-08-08: qty 20

## 2021-08-08 MED ORDER — ONDANSETRON HCL 4 MG/2ML IJ SOLN
INTRAMUSCULAR | Status: DC | PRN
  Administered 2021-08-08: 4 mg via INTRAVENOUS

## 2021-08-08 MED ORDER — MIDAZOLAM HCL 2 MG/2ML IJ SOLN
INTRAMUSCULAR | Status: AC
  Filled 2021-08-08: qty 2

## 2021-08-08 MED ORDER — VANCOMYCIN HCL 1000 MG IV SOLR
INTRAVENOUS | Status: AC
  Filled 2021-08-08: qty 20

## 2021-08-08 MED ORDER — SUCCINYLCHOLINE CHLORIDE 200 MG/10ML IV SOSY
PREFILLED_SYRINGE | INTRAVENOUS | Status: AC
  Filled 2021-08-08: qty 10

## 2021-08-08 MED ORDER — VANCOMYCIN HCL 1000 MG IV SOLR
INTRAVENOUS | Status: DC | PRN
  Administered 2021-08-08: 1000 mg

## 2021-08-08 MED ORDER — CLONIDINE HCL (ANALGESIA) 100 MCG/ML EP SOLN
EPIDURAL | Status: DC | PRN
  Administered 2021-08-08: 100 ug

## 2021-08-08 MED ORDER — AMISULPRIDE (ANTIEMETIC) 5 MG/2ML IV SOLN
10.0000 mg | Freq: Once | INTRAVENOUS | Status: AC
Start: 2021-08-08 — End: 2021-08-08
  Administered 2021-08-08: 10 mg via INTRAVENOUS

## 2021-08-08 SURGICAL SUPPLY — 76 items
BAG COUNTER SPONGE SURGICOUNT (BAG) ×2 IMPLANT
BANDAGE ESMARK 6X9 LF (GAUZE/BANDAGES/DRESSINGS) ×1 IMPLANT
BIT DRILL 3.0 6IN (DRILL) IMPLANT
BIT DRILL 4.5 TYB 9 (BIT) ×1 IMPLANT
BLADE SURG 10 STRL SS (BLADE) ×2 IMPLANT
BNDG COHESIVE 4X5 TAN STRL (GAUZE/BANDAGES/DRESSINGS) ×2 IMPLANT
BNDG ELASTIC 4X5.8 VLCR STR LF (GAUZE/BANDAGES/DRESSINGS) ×2 IMPLANT
BNDG ELASTIC 6X5.8 VLCR STR LF (GAUZE/BANDAGES/DRESSINGS) ×2 IMPLANT
BNDG ESMARK 6X9 LF (GAUZE/BANDAGES/DRESSINGS) ×2
BRUSH SCRUB EZ PLAIN DRY (MISCELLANEOUS) ×4 IMPLANT
CHLORAPREP W/TINT 26 (MISCELLANEOUS) ×2 IMPLANT
CONNECTOR 5 IN 1 STRAIGHT STRL (MISCELLANEOUS) ×2 IMPLANT
COVER MAYO STAND STRL (DRAPES) ×2 IMPLANT
COVER SURGICAL LIGHT HANDLE (MISCELLANEOUS) ×4 IMPLANT
DRAPE C-ARM 42X72 X-RAY (DRAPES) ×2 IMPLANT
DRAPE C-ARMOR (DRAPES) ×2 IMPLANT
DRAPE INCISE IOBAN 66X45 STRL (DRAPES) ×2 IMPLANT
DRAPE ORTHO SPLIT 77X108 STRL (DRAPES) ×2
DRAPE SURG ORHT 6 SPLT 77X108 (DRAPES) ×2 IMPLANT
DRAPE U-SHAPE 47X51 STRL (DRAPES) ×2 IMPLANT
DRESSING MEPILEX FLEX 4X4 (GAUZE/BANDAGES/DRESSINGS) IMPLANT
DRILL 3.0 6IN (DRILL) ×2
DRSG EMULSION OIL 3X3 NADH (GAUZE/BANDAGES/DRESSINGS) ×2 IMPLANT
DRSG MEPILEX BORDER 4X8 (GAUZE/BANDAGES/DRESSINGS) ×2 IMPLANT
DRSG MEPILEX FLEX 4X4 (GAUZE/BANDAGES/DRESSINGS) ×6
DRSG MEPITEL 4X7.2 (GAUZE/BANDAGES/DRESSINGS) ×1 IMPLANT
ELECT REM PT RETURN 9FT ADLT (ELECTROSURGICAL) ×2
ELECTRODE REM PT RTRN 9FT ADLT (ELECTROSURGICAL) ×1 IMPLANT
GAUZE SPONGE 4X4 12PLY STRL (GAUZE/BANDAGES/DRESSINGS) ×2 IMPLANT
GLOVE BIO SURGEON STRL SZ 6.5 (GLOVE) ×6 IMPLANT
GLOVE BIO SURGEON STRL SZ7.5 (GLOVE) ×8 IMPLANT
GLOVE BIOGEL PI IND STRL 6.5 (GLOVE) ×1 IMPLANT
GLOVE BIOGEL PI IND STRL 7.5 (GLOVE) ×1 IMPLANT
GLOVE BIOGEL PI INDICATOR 6.5 (GLOVE) ×1
GLOVE BIOGEL PI INDICATOR 7.5 (GLOVE) ×1
GOWN STRL REUS W/ TWL LRG LVL3 (GOWN DISPOSABLE) ×2 IMPLANT
GOWN STRL REUS W/TWL LRG LVL3 (GOWN DISPOSABLE) ×2
GUIDEWIRE 1.6 6IN (WIRE) ×4 IMPLANT
GUIDEWIRE TYB 2X9 (WIRE) ×4 IMPLANT
KIT BASIN OR (CUSTOM PROCEDURE TRAY) ×2 IMPLANT
KIT TURNOVER KIT B (KITS) ×2 IMPLANT
MANIFOLD NEPTUNE II (INSTRUMENTS) ×2 IMPLANT
NDL HYPO 21X1.5 SAFETY (NEEDLE) IMPLANT
NEEDLE HYPO 21X1.5 SAFETY (NEEDLE) IMPLANT
NS IRRIG 1000ML POUR BTL (IV SOLUTION) ×2 IMPLANT
PACK ORTHO EXTREMITY (CUSTOM PROCEDURE TRAY) ×2 IMPLANT
PACK TOTAL JOINT (CUSTOM PROCEDURE TRAY) ×2 IMPLANT
PAD ARMBOARD 7.5X6 YLW CONV (MISCELLANEOUS) ×4 IMPLANT
PAD CAST 4YDX4 CTTN HI CHSV (CAST SUPPLIES) IMPLANT
PADDING CAST COTTON 4X4 STRL (CAST SUPPLIES) ×1
PADDING CAST COTTON 6X4 STRL (CAST SUPPLIES) ×1 IMPLANT
SCREW CANN HDLS SHT 6.5X70 (Screw) ×2 IMPLANT
SCREW CANN SHT HDLS 4X32 (Screw) ×1 IMPLANT
SCREW CANN SHT HDLS 4X40 (Screw) ×1 IMPLANT
SCREW CANN SHT HDLS 4X44 (Screw) ×1 IMPLANT
SCREW SHANZ 4.0X60MM (EXFIX) IMPLANT
SPLINT FIBERGLASS 4X30 (CAST SUPPLIES) ×1 IMPLANT
SPONGE T-LAP 18X18 ~~LOC~~+RFID (SPONGE) ×2 IMPLANT
STAPLER VISISTAT 35W (STAPLE) IMPLANT
SUCTION FRAZIER HANDLE 10FR (MISCELLANEOUS) ×1
SUCTION TUBE FRAZIER 10FR DISP (MISCELLANEOUS) ×1 IMPLANT
SUT ETHILON 2 0 PSLX (SUTURE) ×2 IMPLANT
SUT ETHILON 3 0 PS 1 (SUTURE) ×6 IMPLANT
SUT MNCRL AB 3-0 PS2 18 (SUTURE) ×2 IMPLANT
SUT MON AB 2-0 CT1 27 (SUTURE) ×1 IMPLANT
SUT MON AB 2-0 CT1 36 (SUTURE) ×3 IMPLANT
SUT PDS AB 0 CT 36 (SUTURE) ×1 IMPLANT
SUT VIC AB 0 CT1 27 (SUTURE) ×1
SUT VIC AB 0 CT1 27XBRD ANBCTR (SUTURE) ×1 IMPLANT
SUT VIC AB 2-0 CT1 27 (SUTURE)
SUT VIC AB 2-0 CT1 TAPERPNT 27 (SUTURE) IMPLANT
SYR CONTROL 10ML LL (SYRINGE) IMPLANT
TOWEL GREEN STERILE (TOWEL DISPOSABLE) ×4 IMPLANT
TOWEL GREEN STERILE FF (TOWEL DISPOSABLE) ×4 IMPLANT
TUBE CONNECTING 12X1/4 (SUCTIONS) ×4 IMPLANT
UNDERPAD 30X36 HEAVY ABSORB (UNDERPADS AND DIAPERS) ×2 IMPLANT

## 2021-08-08 NOTE — Progress Notes (Signed)
Pt is off the unit to OR.

## 2021-08-08 NOTE — Anesthesia Postprocedure Evaluation (Signed)
Anesthesia Post Note  Patient: Shelley [name]  Procedure(s) Performed: OPEN REDUCTION INTERNAL FIXATION (ORIF) CALCANEOUS FRACTURE (Right) REMOVAL EXTERNAL FIXATION LEG (Right)     Patient location during evaluation: PACU Anesthesia Type: Regional and General Level of consciousness: awake and alert Pain management: pain level controlled Vital Signs Assessment: post-procedure vital signs reviewed and stable Respiratory status: spontaneous breathing, nonlabored ventilation, respiratory function stable and patient connected to nasal cannula oxygen Cardiovascular status: blood pressure returned to baseline and stable Postop Assessment: no apparent nausea or vomiting Anesthetic complications: no   No notable events documented.  Last Vitals:  Vitals:   08/08/21 1600 08/08/21 1629  BP: 128/76 136/88  Pulse: 98 97  Resp: (!) 22 19  Temp: 36.7 C 37 C  SpO2: 96% 95%    Last Pain:  Vitals:   08/08/21 1656  TempSrc:   PainSc: 3                  [name] P [name]

## 2021-08-08 NOTE — Anesthesia Preprocedure Evaluation (Addendum)
Anesthesia Evaluation  Patient identified by MRN, date of birth, ID band Patient awake    Reviewed: Allergy & Precautions, NPO status , Patient's Chart, lab work & pertinent test results  Airway Mallampati: II  TM Distance: >3 FB Neck ROM: Full    Dental no notable dental hx.    Pulmonary Current Smoker,    Pulmonary exam normal        Cardiovascular hypertension,  Rhythm:Regular Rate:Normal     Neuro/Psych Anxiety Depression negative neurological ROS     GI/Hepatic Neg liver ROS, GERD  Medicated,  Endo/Other  negative endocrine ROS  Renal/GU   negative genitourinary   Musculoskeletal Right calcaneal fx   Abdominal Normal abdominal exam  (+)   Peds  Hematology negative hematology ROS (+)   Anesthesia Other Findings   Reproductive/Obstetrics                           Anesthesia Physical Anesthesia Plan  ASA: 2  Anesthesia Plan: General and Regional   Post-op Pain Management: Regional block*   Induction: Intravenous  PONV Risk Score and Plan: 2 and Ondansetron, Dexamethasone, Midazolam and Treatment may vary due to age or medical condition  Airway Management Planned: Mask and LMA  Additional Equipment: None  Intra-op Plan:   Post-operative Plan: Extubation in OR  Informed Consent: I have reviewed the patients History and Physical, chart, labs and discussed the procedure including the risks, benefits and alternatives for the proposed anesthesia with the patient or authorized representative who has indicated his/her understanding and acceptance.     Dental advisory given  Plan Discussed with: CRNA  Anesthesia Plan Comments:         Anesthesia Quick Evaluation

## 2021-08-08 NOTE — Anesthesia Procedure Notes (Signed)
Anesthesia Regional Block: Adductor canal block   Pre-Anesthetic Checklist: , timeout performed,  Correct Patient, Correct Site, Correct Laterality,  Correct Procedure, Correct Position, site marked,  Risks and benefits discussed,  Surgical consent,  Pre-op evaluation,  At surgeon's request and post-op pain management  Laterality: Right  Prep: Dura Prep       Needles:  Injection technique: Single-shot  Needle Type: Echogenic Stimulator Needle     Needle Length: 10cm  Needle Gauge: 20     Additional Needles:   Procedures:,,,, ultrasound used (permanent image in chart),,    Narrative:  Start time: 08/08/2021 11:16 AM End time: 08/08/2021 11:19 AM Injection made incrementally with aspirations every 5 mL.  Performed by: Personally  Anesthesiologist: Atilano Median, DO  Additional Notes: Patient identified. Risks/Benefits/Options discussed with patient including but not limited to bleeding, infection, nerve damage, failed block, incomplete pain control. Patient expressed understanding and wished to proceed. All questions were answered. Sterile technique was used throughout the entire procedure. Please see nursing notes for vital signs. Aspirated in 5cc intervals with injection for negative confirmation. Patient was given instructions on fall risk and not to get out of bed. All questions and concerns addressed with instructions to call with any issues or inadequate analgesia.

## 2021-08-08 NOTE — Transfer of Care (Signed)
Immediate Anesthesia Transfer of Care Note  Patient: Shelley [name]  Procedure(s) Performed: OPEN REDUCTION INTERNAL FIXATION (ORIF) CALCANEOUS FRACTURE (Right) REMOVAL EXTERNAL FIXATION LEG (Right)  Patient Location: PACU  Anesthesia Type:General  Level of Consciousness: drowsy  Airway & Oxygen Therapy: Patient Spontanous Breathing  Post-op Assessment: Report given to RN and Post -op Vital signs reviewed and stable  Post vital signs: Reviewed and stable  Last Vitals:  Vitals Value Taken Time  BP    Temp    Pulse    Resp    SpO2      Last Pain:  Vitals:   08/08/21 1142  TempSrc:   PainSc: 0-No pain      Patients Stated Pain Goal: 0 (08/07/21 1950)  Complications: No notable events documented.
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
## 2021-08-08 NOTE — Progress Notes (Signed)
Pt returned to unit from PACU denies pain at this time. Complaining of continued nausea. Assessment and VS documented.
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
## 2021-08-08 NOTE — Anesthesia Procedure Notes (Signed)
Anesthesia Regional Block: Popliteal block   Pre-Anesthetic Checklist: , timeout performed,  Correct Patient, Correct Site, Correct Laterality,  Correct Procedure, Correct Position, site marked,  Risks and benefits discussed,  Surgical consent,  Pre-op evaluation,  At surgeon's request and post-op pain management  Laterality: Right  Prep: Dura Prep       Needles:  Injection technique: Single-shot  Needle Type: Echogenic Stimulator Needle     Needle Length: 10cm  Needle Gauge: 20     Additional Needles:   Procedures:,,,, ultrasound used (permanent image in chart),,    Narrative:  Start time: 08/08/2021 11:20 AM End time: 08/08/2021 11:23 AM Injection made incrementally with aspirations every 5 mL.  Performed by: Personally  Anesthesiologist: Atilano Median, DO  Additional Notes: Patient identified. Risks/Benefits/Options discussed with patient including but not limited to bleeding, infection, nerve damage, failed block, incomplete pain control. Patient expressed understanding and wished to proceed. All questions were answered. Sterile technique was used throughout the entire procedure. Please see nursing notes for vital signs. Aspirated in 5cc intervals with injection for negative confirmation. Patient was given instructions on fall risk and not to get out of bed. All questions and concerns addressed with instructions to call with any issues or inadequate analgesia.

## 2021-08-08 NOTE — Interval H&P Note (Signed)
History and Physical Interval Note:  08/08/2021 11:28 AM  Shelley [name]  has presented today for surgery, with the diagnosis of Left periprosthetic femur fracture.  The various methods of treatment have been discussed with the patient and family. After consideration of risks, benefits and other options for treatment, the patient has consented to  Procedure(s): OPEN REDUCTION INTERNAL FIXATION (ORIF) CALCANEOUS FRACTURE (Right) REMOVAL EXTERNAL FIXATION LEG (Right)  POSSIBLE SURGICAL FIXATION OF LEFT FOOT as a surgical intervention.  The patient's history has been reviewed, patient examined, no change in status, stable for surgery.  I have reviewed the patient's chart and labs.  Questions were answered to the patient's satisfaction.     Caryn Bee P Huzaifa Viney

## 2021-08-09 LAB — BASIC METABOLIC PANEL
Anion gap: 12 (ref 5–15)
BUN: 13 mg/dL (ref 6–20)
CO2: 24 mmol/L (ref 22–32)
Calcium: 8 mg/dL — ABNORMAL LOW (ref 8.9–10.3)
Chloride: 103 mmol/L (ref 98–111)
Creatinine, Ser: 0.71 mg/dL (ref 0.44–1.00)
GFR, Estimated: >60 mL/min (ref >60)
Glucose, Bld: 126 mg/dL — ABNORMAL HIGH (ref 70–99)
Potassium: 3.6 mmol/L (ref 3.5–5.1)
Sodium: 139 mmol/L (ref 135–145)

## 2021-08-09 LAB — GLUCOSE, CAPILLARY
Glucose-Capillary: 104 mg/dL — ABNORMAL HIGH (ref 70–99)
Glucose-Capillary: 108 mg/dL — ABNORMAL HIGH (ref 70–99)
Glucose-Capillary: 117 mg/dL — ABNORMAL HIGH (ref 70–99)
Glucose-Capillary: 121 mg/dL — ABNORMAL HIGH (ref 70–99)
Glucose-Capillary: 121 mg/dL — ABNORMAL HIGH (ref 70–99)
Glucose-Capillary: 99 mg/dL (ref 70–99)

## 2021-08-09 LAB — CBC
HCT: 24.7 % — ABNORMAL LOW (ref 36.0–46.0)
Hemoglobin: 8.4 g/dL — ABNORMAL LOW (ref 12.0–15.0)
MCH: 30.1 pg (ref 26.0–34.0)
MCHC: 34 g/dL (ref 30.0–36.0)
MCV: 88.5 fL (ref 80.0–100.0)
Platelets: 132 10*3/uL — ABNORMAL LOW (ref 150–400)
RBC: 2.79 MIL/uL — ABNORMAL LOW (ref 3.87–5.11)
RDW: 13.2 % (ref 11.5–15.5)
WBC: 9 10*3/uL (ref 4.0–10.5)
nRBC: 0.2 % (ref 0.0–0.2)

## 2021-08-09 MED ORDER — IOHEXOL 9 MG/ML PO SOLN
ORAL | Status: AC
  Administered 2021-08-09: 500 mL
  Filled 2021-08-09: qty 1000

## 2021-08-09 MED ORDER — VITAMIN D (ERGOCALCIFEROL) 1.25 MG (50000 UNIT) PO CAPS
50000.0000 [IU] | ORAL_CAPSULE | ORAL | Status: DC
  Administered 2021-08-09 – 2021-08-23 (×3): 50000 [IU] via ORAL
  Filled 2021-08-09 (×4): qty 1

## 2021-08-09 NOTE — Progress Notes (Signed)
Inpatient Rehab Coordinator Note:  I met with patient at bedside to discuss CIR recommendations and goals/expectations of CIR stay.  We reviewed 3 hrs/day of therapy, physician follow up, and average length of stay 2 weeks (dependent upon progress) with goals of likely min to mod assist, w/c level.  She reports plans to d/c home to her parents house, and they are building a ramp for her and working on getting friends/family to come assist with care needs at discharge.  Plans for further surgery tomorrow, I will call family to update on CIR plans and continue to follow.  Prior Josem Kaufmann will be required and I will try to start this today versus tomorrow after surgery.   Shann Medal, PT, DPT Admissions Coordinator [phone] 08/09/21  1:13 PM
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
## 2021-08-09 NOTE — TOC CAGE-AID Note (Signed)
Transition of Care [facility]) - CAGE-AID Screening   Patient Details  Name: Shelley [name] MRN: [id number] Date of Birth: March 23, 1985  [facility] of [facility]) CM/SW Contact:    Katha Hamming, RN Phone Number:[phone] 08/09/2021, 9:33 PM     CAGE-AID Screening:    Have You Ever Felt You Ought to Cut Down on Your Drinking or Drug Use?: No Have People Annoyed You By Critizing Your Drinking Or Drug Use?: No Have You Felt Bad Or Guilty About Your Drinking Or Drug Use?: No Have You Ever Had a Drink or Used Drugs First Thing In The Morning to Steady Your Nerves or to Get Rid of a Hangover?: No CAGE-AID Score: 0  Substance Abuse Education Offered: No (some marijuana smoking, declines resources)
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
## 2021-08-09 NOTE — Anesthesia Preprocedure Evaluation (Addendum)
Anesthesia Evaluation  Patient identified by MRN, date of birth, ID band Patient awake    Reviewed: Allergy & Precautions, NPO status , Patient's Chart, lab work & pertinent test results  Airway Mallampati: II  TM Distance: >3 FB Neck ROM: Full    Dental no notable dental hx.    Pulmonary Current Smoker and Patient abstained from smoking.,    Pulmonary exam normal        Cardiovascular hypertension,  Rhythm:Regular Rate:Normal     Neuro/Psych Anxiety Depression negative neurological ROS     GI/Hepatic Neg liver ROS, GERD  Medicated,  Endo/Other  negative endocrine ROS  Renal/GU   negative genitourinary   Musculoskeletal Right calcaneal fx   Abdominal Normal abdominal exam  (+)   Peds  Hematology  (+) Blood dyscrasia, anemia ,   Anesthesia Other Findings   Reproductive/Obstetrics                            Anesthesia Physical Anesthesia Plan  ASA: 3  Anesthesia Plan: General and Regional   Post-op Pain Management: Tylenol PO (pre-op)*   Induction: Intravenous  PONV Risk Score and Plan: 2 and Treatment may vary due to age or medical condition, Ondansetron, Dexamethasone, Scopolamine patch - Pre-op and Midazolam  Airway Management Planned: Oral ETT  Additional Equipment: None  Intra-op Plan:   Post-operative Plan: Extubation in OR  Informed Consent: I have reviewed the patients History and Physical, chart, labs and discussed the procedure including the risks, benefits and alternatives for the proposed anesthesia with the patient or authorized representative who has indicated his/her understanding and acceptance.     Dental advisory given  Plan Discussed with: CRNA, Anesthesiologist and Surgeon  Anesthesia Plan Comments: (GETA. Patient has dressings and swelling over the area for the nerve block. Also did not notice any benefit from previous nerve block due to numerous pain  sources. + nausea due to constipation and contrast Tanna Furry, MD  )      Anesthesia Quick Evaluation
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
## 2021-08-09 NOTE — Addendum Note (Signed)
Addendum  created 08/09/21 0723 by Adria Dill, CRNA   Order list changed, Pharmacy for encounter modified

## 2021-08-09 NOTE — Progress Notes (Signed)
Trauma Event Note    TRN to bedside to round. Pt sleeping, does not rouse to name being called, equal chest rise and fall. Visitor at bedside. VSS, did not disturb.  Last imported Vital Signs BP (!) 151/78 (BP Location: Right Arm)   Pulse 93   Temp 99.1 F (37.3 C) (Oral)   Resp 20   Ht 5\' 9"  (1.753 m)   Wt 188 lb (85.3 kg)   SpO2 98%   BMI 27.76 kg/m   Trending CBC Recent Labs    08/06/21 1031 08/07/21 0318 08/08/21 0430  WBC  --  9.8 7.8  HGB 9.2* 10.4* 9.2*  HCT 27.0* 29.4* 26.1*  PLT  --  96* 109*    Trending Coag's No results for input(s): "APTT", "INR" in the last 72 hours.  Trending BMET Recent Labs    08/06/21 1031 08/07/21 0318 08/08/21 0430  NA 136 139 136  K 4.2 3.6 3.2*  CL  --  104 100  CO2  --  27 24  BUN  --  10 11  CREATININE  --  0.80 0.78  GLUCOSE  --  98 83      Khoen Genet O Alizay Bronkema  Trauma Response RN  Please call TRN at [phone] for further assistance.

## 2021-08-10 ENCOUNTER — Inpatient Hospital Stay (HOSPITAL_COMMUNITY): Payer: Medicaid Other

## 2021-08-10 ENCOUNTER — Inpatient Hospital Stay (HOSPITAL_COMMUNITY): Payer: Medicaid Other | Admitting: Certified Registered Nurse Anesthetist

## 2021-08-10 ENCOUNTER — Encounter (HOSPITAL_COMMUNITY): Admission: EM | Disposition: A | Discharge status: Rehabilitation Facility | Payer: Self-pay | Source: Home / Self Care

## 2021-08-10 ENCOUNTER — Other Ambulatory Visit: Payer: Self-pay

## 2021-08-10 DIAGNOSIS — S92102A Unspecified fracture of left talus, initial encounter for closed fracture: Secondary | ICD-10-CM | POA: Diagnosis not present

## 2021-08-10 DIAGNOSIS — S92002A Unspecified fracture of left calcaneus, initial encounter for closed fracture: Secondary | ICD-10-CM | POA: Diagnosis not present

## 2021-08-10 DIAGNOSIS — I10 Essential (primary) hypertension: Secondary | ICD-10-CM

## 2021-08-10 DIAGNOSIS — F17200 Nicotine dependence, unspecified, uncomplicated: Secondary | ICD-10-CM | POA: Diagnosis not present

## 2021-08-10 HISTORY — PX: ORIF CALCANEOUS FRACTURE: SHX5030

## 2021-08-10 LAB — CBC
HCT: 29.6 % — ABNORMAL LOW (ref 36.0–46.0)
Hemoglobin: 10.1 g/dL — ABNORMAL LOW (ref 12.0–15.0)
MCH: 30.4 pg (ref 26.0–34.0)
MCHC: 34.1 g/dL (ref 30.0–36.0)
MCV: 89.2 fL (ref 80.0–100.0)
Platelets: 201 10*3/uL (ref 150–400)
RBC: 3.32 MIL/uL — ABNORMAL LOW (ref 3.87–5.11)
RDW: 13.4 % (ref 11.5–15.5)
WBC: 9.4 10*3/uL (ref 4.0–10.5)
nRBC: 0.3 % — ABNORMAL HIGH (ref 0.0–0.2)

## 2021-08-10 LAB — BASIC METABOLIC PANEL
Anion gap: 11 (ref 5–15)
BUN: 14 mg/dL (ref 6–20)
CO2: 24 mmol/L (ref 22–32)
Calcium: 8 mg/dL — ABNORMAL LOW (ref 8.9–10.3)
Chloride: 104 mmol/L (ref 98–111)
Creatinine, Ser: 0.63 mg/dL (ref 0.44–1.00)
GFR, Estimated: >60 mL/min (ref >60)
Glucose, Bld: 102 mg/dL — ABNORMAL HIGH (ref 70–99)
Potassium: 3.5 mmol/L (ref 3.5–5.1)
Sodium: 139 mmol/L (ref 135–145)

## 2021-08-10 LAB — GLUCOSE, CAPILLARY
Glucose-Capillary: 104 mg/dL — ABNORMAL HIGH (ref 70–99)
Glucose-Capillary: 123 mg/dL — ABNORMAL HIGH (ref 70–99)
Glucose-Capillary: 110 mg/dL — ABNORMAL HIGH (ref 70–99)
Glucose-Capillary: 101 mg/dL — ABNORMAL HIGH (ref 70–99)
Glucose-Capillary: 93 mg/dL (ref 70–99)

## 2021-08-10 SURGERY — OPEN REDUCTION INTERNAL FIXATION (ORIF) CALCANEOUS FRACTURE
Anesthesia: Regional | Site: Foot | Laterality: Left

## 2021-08-10 MED ORDER — PANTOPRAZOLE 2 MG/ML SUSPENSION
40.0000 mg | Freq: Every day | ORAL | Status: DC
  Administered 2021-08-11 – 2021-08-13 (×3): 40 mg
  Filled 2021-08-10 (×3): qty 20

## 2021-08-10 MED ORDER — VANCOMYCIN HCL 1000 MG IV SOLR
INTRAVENOUS | Status: DC | PRN
  Administered 2021-08-10: 1000 mg via TOPICAL

## 2021-08-10 MED ORDER — OXYCODONE HCL 5 MG PO TABS: 5.0000 mg | ORAL_TABLET | ORAL | Status: DC | PRN

## 2021-08-10 MED ORDER — ACETAMINOPHEN 10 MG/ML IV SOLN
INTRAVENOUS | Status: AC
  Filled 2021-08-10: qty 100

## 2021-08-10 MED ORDER — MIDAZOLAM HCL 2 MG/2ML IJ SOLN
INTRAMUSCULAR | Status: AC
Start: 2021-08-10 — End: ?
  Filled 2021-08-10: qty 2

## 2021-08-10 MED ORDER — DOCUSATE SODIUM 50 MG/5ML PO LIQD
100.0000 mg | Freq: Two times a day (BID) | ORAL | Status: DC
  Administered 2021-08-10 – 2021-08-12 (×4): 100 mg
  Filled 2021-08-10 (×5): qty 10

## 2021-08-10 MED ORDER — FENTANYL CITRATE (PF) 100 MCG/2ML IJ SOLN
INTRAMUSCULAR | Status: AC
  Filled 2021-08-10: qty 2

## 2021-08-10 MED ORDER — 0.9 % SODIUM CHLORIDE (POUR BTL) OPTIME
TOPICAL | Status: DC | PRN
  Administered 2021-08-10: 1000 mL

## 2021-08-10 MED ORDER — CALCIUM CARBONATE ANTACID 1250 MG/5ML PO SUSP
500.0000 mg | Freq: Three times a day (TID) | ORAL | Status: DC
  Administered 2021-08-10 – 2021-08-13 (×8): 500 mg
  Filled 2021-08-10 (×10): qty 5

## 2021-08-10 MED ORDER — AMISULPRIDE (ANTIEMETIC) 5 MG/2ML IV SOLN
10.0000 mg | Freq: Once | INTRAVENOUS | Status: DC | PRN
Start: 2021-08-10 — End: 2021-08-10

## 2021-08-10 MED ORDER — METOCLOPRAMIDE HCL 5 MG PO TABS: 5.0000 mg | ORAL_TABLET | Freq: Three times a day (TID) | ORAL | Status: DC | PRN

## 2021-08-10 MED ORDER — TRAMADOL HCL 50 MG PO TABS: 50.0000 mg | ORAL_TABLET | Freq: Four times a day (QID) | ORAL | Status: DC | PRN

## 2021-08-10 MED ORDER — ACETAMINOPHEN 10 MG/ML IV SOLN
INTRAVENOUS | Status: DC | PRN
  Administered 2021-08-10: 1000 mg via INTRAVENOUS

## 2021-08-10 MED ORDER — OXYCODONE HCL 5 MG PO TABS
10.0000 mg | ORAL_TABLET | ORAL | Status: DC | PRN
  Administered 2021-08-13 (×3): 10 mg
  Filled 2021-08-10 (×4): qty 2

## 2021-08-10 MED ORDER — PROPOFOL 10 MG/ML IV BOLUS
INTRAVENOUS | Status: AC
  Filled 2021-08-10: qty 20

## 2021-08-10 MED ORDER — PROPOFOL 10 MG/ML IV BOLUS
INTRAVENOUS | Status: DC | PRN
  Administered 2021-08-10: 200 mg via INTRAVENOUS

## 2021-08-10 MED ORDER — CEFAZOLIN SODIUM-DEXTROSE 2-4 GM/100ML-% IV SOLN
INTRAVENOUS | Status: AC
  Filled 2021-08-10: qty 100

## 2021-08-10 MED ORDER — DEXAMETHASONE SODIUM PHOSPHATE 10 MG/ML IJ SOLN
INTRAMUSCULAR | Status: DC | PRN
  Administered 2021-08-10: 10 mg via INTRAVENOUS

## 2021-08-10 MED ORDER — MIDAZOLAM HCL 2 MG/2ML IJ SOLN
INTRAMUSCULAR | Status: DC | PRN
  Administered 2021-08-10: 2 mg via INTRAVENOUS

## 2021-08-10 MED ORDER — ONDANSETRON HCL 4 MG/2ML IJ SOLN
4.0000 mg | Freq: Once | INTRAMUSCULAR | Status: DC | PRN
Start: 2021-08-10 — End: 2021-08-10

## 2021-08-10 MED ORDER — OXYCODONE HCL 5 MG/5ML PO SOLN: 5.0000 mg | Freq: Once | ORAL | Status: DC | PRN

## 2021-08-10 MED ORDER — DIPHENHYDRAMINE HCL 50 MG/ML IJ SOLN: 12.5000 mg | Freq: Four times a day (QID) | INTRAMUSCULAR | Status: DC | PRN

## 2021-08-10 MED ORDER — METOCLOPRAMIDE HCL 5 MG/ML IJ SOLN
5.0000 mg | Freq: Three times a day (TID) | INTRAMUSCULAR | Status: DC | PRN
  Administered 2021-08-11: 10 mg via INTRAVENOUS
  Filled 2021-08-10: qty 2

## 2021-08-10 MED ORDER — SCOPOLAMINE 1 MG/3DAYS TD PT72
MEDICATED_PATCH | TRANSDERMAL | Status: AC
  Filled 2021-08-10: qty 1

## 2021-08-10 MED ORDER — IOHEXOL 9 MG/ML PO SOLN
ORAL | Status: AC
  Filled 2021-08-10: qty 1000

## 2021-08-10 MED ORDER — IOHEXOL 300 MG/ML  SOLN
100.0000 mL | Freq: Once | INTRAMUSCULAR | Status: AC | PRN
  Administered 2021-08-10: 100 mL via INTRAVENOUS

## 2021-08-10 MED ORDER — SUCCINYLCHOLINE CHLORIDE 200 MG/10ML IV SOSY
PREFILLED_SYRINGE | INTRAVENOUS | Status: DC | PRN
  Administered 2021-08-10: 160 mg via INTRAVENOUS

## 2021-08-10 MED ORDER — SUGAMMADEX SODIUM 200 MG/2ML IV SOLN
INTRAVENOUS | Status: DC | PRN
  Administered 2021-08-10 (×2): 100 mg via INTRAVENOUS

## 2021-08-10 MED ORDER — ROCURONIUM BROMIDE 10 MG/ML (PF) SYRINGE
PREFILLED_SYRINGE | INTRAVENOUS | Status: DC | PRN
  Administered 2021-08-10: 40 mg via INTRAVENOUS
  Administered 2021-08-10: 10 mg via INTRAVENOUS

## 2021-08-10 MED ORDER — CEFAZOLIN SODIUM-DEXTROSE 2-3 GM-%(50ML) IV SOLR
INTRAVENOUS | Status: DC | PRN
  Administered 2021-08-10: 2 g via INTRAVENOUS

## 2021-08-10 MED ORDER — FENTANYL CITRATE (PF) 250 MCG/5ML IJ SOLN
INTRAMUSCULAR | Status: DC | PRN
  Administered 2021-08-10 (×4): 50 ug via INTRAVENOUS

## 2021-08-10 MED ORDER — LACTATED RINGERS IV SOLN: INTRAVENOUS | Status: DC | PRN

## 2021-08-10 MED ORDER — MENTHOL 3 MG MT LOZG
1.0000 | LOZENGE | OROMUCOSAL | Status: DC | PRN
  Filled 2021-08-10: qty 9

## 2021-08-10 MED ORDER — SCOPOLAMINE 1 MG/3DAYS TD PT72
MEDICATED_PATCH | TRANSDERMAL | Status: DC | PRN
  Administered 2021-08-10: 1 via TRANSDERMAL

## 2021-08-10 MED ORDER — LIDOCAINE 2% (20 MG/ML) 5 ML SYRINGE
INTRAMUSCULAR | Status: DC | PRN
  Administered 2021-08-10: 60 mg via INTRAVENOUS

## 2021-08-10 MED ORDER — METHOCARBAMOL 1000 MG/10ML IJ SOLN: 500.0000 mg | Freq: Four times a day (QID) | INTRAMUSCULAR | Status: DC | PRN

## 2021-08-10 MED ORDER — VANCOMYCIN HCL 1000 MG IV SOLR
INTRAVENOUS | Status: AC
  Filled 2021-08-10: qty 20

## 2021-08-10 MED ORDER — POLYETHYLENE GLYCOL 3350 17 G PO PACK: 17.0000 g | PACK | Freq: Every day | ORAL | Status: DC | PRN

## 2021-08-10 MED ORDER — FENTANYL CITRATE (PF) 250 MCG/5ML IJ SOLN
INTRAMUSCULAR | Status: AC
  Filled 2021-08-10: qty 5

## 2021-08-10 MED ORDER — ROCURONIUM BROMIDE 10 MG/ML (PF) SYRINGE
PREFILLED_SYRINGE | INTRAVENOUS | Status: AC
  Filled 2021-08-10: qty 10

## 2021-08-10 MED ORDER — METHOCARBAMOL 500 MG PO TABS
1000.0000 mg | ORAL_TABLET | Freq: Three times a day (TID) | ORAL | Status: DC
  Administered 2021-08-11 – 2021-08-13 (×6): 1000 mg
  Filled 2021-08-10 (×7): qty 2

## 2021-08-10 MED ORDER — BETHANECHOL CHLORIDE 10 MG PO TABS
10.0000 mg | ORAL_TABLET | Freq: Three times a day (TID) | ORAL | Status: DC
  Administered 2021-08-11 – 2021-08-13 (×7): 10 mg
  Filled 2021-08-10 (×8): qty 1

## 2021-08-10 MED ORDER — ACETAMINOPHEN 160 MG/5ML PO SOLN
1000.0000 mg | Freq: Four times a day (QID) | ORAL | Status: DC
  Administered 2021-08-11 – 2021-08-13 (×4): 1000 mg
  Filled 2021-08-10 (×4): qty 40.6

## 2021-08-10 MED ORDER — GABAPENTIN 250 MG/5ML PO SOLN
300.0000 mg | Freq: Three times a day (TID) | ORAL | Status: DC
  Administered 2021-08-10 – 2021-08-13 (×7): 300 mg
  Filled 2021-08-10 (×9): qty 6

## 2021-08-10 MED ORDER — METHOCARBAMOL 500 MG PO TABS: 500.0000 mg | ORAL_TABLET | Freq: Four times a day (QID) | ORAL | Status: DC | PRN

## 2021-08-10 MED ORDER — ONDANSETRON HCL 4 MG/2ML IJ SOLN
INTRAMUSCULAR | Status: DC | PRN
  Administered 2021-08-10: 4 mg via INTRAVENOUS

## 2021-08-10 MED ORDER — DEXAMETHASONE SODIUM PHOSPHATE 10 MG/ML IJ SOLN
INTRAMUSCULAR | Status: AC
Start: 2021-08-10 — End: ?
  Filled 2021-08-10: qty 1

## 2021-08-10 MED ORDER — LIDOCAINE 2% (20 MG/ML) 5 ML SYRINGE
INTRAMUSCULAR | Status: AC
  Filled 2021-08-10: qty 5

## 2021-08-10 MED ORDER — FENTANYL CITRATE (PF) 100 MCG/2ML IJ SOLN
25.0000 ug | INTRAMUSCULAR | Status: DC | PRN
  Administered 2021-08-10 (×3): 50 ug via INTRAVENOUS

## 2021-08-10 MED ORDER — DIPHENHYDRAMINE HCL 12.5 MG/5ML PO ELIX: 12.5000 mg | ORAL_SOLUTION | Freq: Four times a day (QID) | ORAL | Status: DC | PRN

## 2021-08-10 MED ORDER — OXYCODONE HCL 5 MG PO TABS: 5.0000 mg | ORAL_TABLET | Freq: Once | ORAL | Status: DC | PRN

## 2021-08-10 MED ORDER — PHENOL 1.4 % MT LIQD: 1.0000 | OROMUCOSAL | Status: DC | PRN

## 2021-08-10 MED ORDER — PHENYLEPHRINE 80 MCG/ML (10ML) SYRINGE FOR IV PUSH (FOR BLOOD PRESSURE SUPPORT)
PREFILLED_SYRINGE | INTRAVENOUS | Status: AC
Start: 2021-08-10 — End: ?
  Filled 2021-08-10: qty 10

## 2021-08-10 MED ORDER — ONDANSETRON HCL 4 MG/2ML IJ SOLN
INTRAMUSCULAR | Status: AC
  Filled 2021-08-10: qty 2

## 2021-08-10 SURGICAL SUPPLY — 78 items
BAG COUNTER SPONGE SURGICOUNT (BAG) ×2 IMPLANT
BANDAGE ESMARK 6X9 LF (GAUZE/BANDAGES/DRESSINGS) ×1 IMPLANT
BIT DRILL 2 FAST STEP (BIT) ×1 IMPLANT
BIT DRILL 3.0 6IN (DRILL) IMPLANT
BLADE SURG 10 STRL SS (BLADE) ×1 IMPLANT
BNDG COHESIVE 4X5 TAN STRL (GAUZE/BANDAGES/DRESSINGS) ×1 IMPLANT
BNDG ELASTIC 4X5.8 VLCR STR LF (GAUZE/BANDAGES/DRESSINGS) ×2 IMPLANT
BNDG ELASTIC 6X5.8 VLCR STR LF (GAUZE/BANDAGES/DRESSINGS) ×1 IMPLANT
BNDG ESMARK 6X9 LF (GAUZE/BANDAGES/DRESSINGS)
BRUSH SCRUB EZ PLAIN DRY (MISCELLANEOUS) ×3 IMPLANT
CHLORAPREP W/TINT 26 (MISCELLANEOUS) ×2 IMPLANT
CONNECTOR 5 IN 1 STRAIGHT STRL (MISCELLANEOUS) ×1 IMPLANT
COVER MAYO STAND STRL (DRAPES) ×2 IMPLANT
COVER SURGICAL LIGHT HANDLE (MISCELLANEOUS) ×3 IMPLANT
DRAPE C-ARM 42X72 X-RAY (DRAPES) ×2 IMPLANT
DRAPE C-ARMOR (DRAPES) ×2 IMPLANT
DRAPE EXTREMITY T 121X128X90 (DISPOSABLE) ×1 IMPLANT
DRAPE HALF SHEET 40X57 (DRAPES) ×1 IMPLANT
DRAPE IMP U-DRAPE 54X76 (DRAPES) ×1 IMPLANT
DRAPE INCISE IOBAN 66X45 STRL (DRAPES) ×1 IMPLANT
DRAPE ORTHO SPLIT 77X108 STRL (DRAPES) ×1
DRAPE SURG ORHT 6 SPLT 77X108 (DRAPES) ×2 IMPLANT
DRAPE U-SHAPE 47X51 STRL (DRAPES) ×2 IMPLANT
DRILL 3.0 6IN (DRILL) ×2
DRSG EMULSION OIL 3X3 NADH (GAUZE/BANDAGES/DRESSINGS) ×1 IMPLANT
DRSG MEPITEL 4X7.2 (GAUZE/BANDAGES/DRESSINGS) ×1 IMPLANT
ELECT REM PT RETURN 9FT ADLT (ELECTROSURGICAL) ×2
ELECTRODE REM PT RTRN 9FT ADLT (ELECTROSURGICAL) ×1 IMPLANT
GAUZE SPONGE 4X4 12PLY STRL (GAUZE/BANDAGES/DRESSINGS) ×2 IMPLANT
GLOVE BIO SURGEON STRL SZ 6.5 (GLOVE) ×6 IMPLANT
GLOVE BIO SURGEON STRL SZ7.5 (GLOVE) ×8 IMPLANT
GLOVE BIOGEL PI IND STRL 6.5 (GLOVE) ×1 IMPLANT
GLOVE BIOGEL PI IND STRL 7.5 (GLOVE) ×1 IMPLANT
GLOVE BIOGEL PI INDICATOR 6.5 (GLOVE) ×1
GLOVE BIOGEL PI INDICATOR 7.5 (GLOVE) ×1
GOWN STRL REUS W/ TWL LRG LVL3 (GOWN DISPOSABLE) ×2 IMPLANT
GOWN STRL REUS W/TWL LRG LVL3 (GOWN DISPOSABLE) ×2
GUIDEWIRE 1.6 6IN (WIRE) ×2 IMPLANT
K-WIRE ACE 1.6X6 (WIRE) ×4
KIT BASIN OR (CUSTOM PROCEDURE TRAY) ×2 IMPLANT
KIT TURNOVER KIT B (KITS) ×2 IMPLANT
KWIRE ACE 1.6X6 (WIRE) IMPLANT
MANIFOLD NEPTUNE II (INSTRUMENTS) ×2 IMPLANT
NDL HYPO 21X1.5 SAFETY (NEEDLE) IMPLANT
NEEDLE HYPO 21X1.5 SAFETY (NEEDLE) IMPLANT
NS IRRIG 1000ML POUR BTL (IV SOLUTION) ×2 IMPLANT
PACK GENERAL/GYN (CUSTOM PROCEDURE TRAY) ×1 IMPLANT
PACK ORTHO EXTREMITY (CUSTOM PROCEDURE TRAY) ×1 IMPLANT
PAD ARMBOARD 7.5X6 YLW CONV (MISCELLANEOUS) ×3 IMPLANT
PAD CAST 4YDX4 CTTN HI CHSV (CAST SUPPLIES) ×2 IMPLANT
PADDING CAST COTTON 4X4 STRL (CAST SUPPLIES) ×1
PADDING CAST COTTON 6X4 STRL (CAST SUPPLIES) ×1 IMPLANT
PEG NONLOCK 2.5X34 (Screw) ×1 IMPLANT
PIN SHANTZ 5MM (PIN) ×1 IMPLANT
PLATE CALC L .2H SM (Plate) ×1 IMPLANT
SCREW CANN SHT HDLS 4X36 (Screw) ×1 IMPLANT
SCREW CANN SHT HDLS 4X42 (Screw) ×1 IMPLANT
SCREW CORTICAL LOW PROF 3.5X34 (Screw) ×1 IMPLANT
SCREW LOCK CORT STAR 3.5X26 (Screw) ×1 IMPLANT
SCREW LOCK CORT STAR 3.5X34 (Screw) ×1 IMPLANT
SCREW LOW PROF TIS 3.5X28MM (Screw) ×1 IMPLANT
SCREW LOW PROFILE 3.5X30MM TIS (Screw) ×1 IMPLANT
SCREW SHANZ 4.0X60MM (EXFIX) IMPLANT
SCREW T15 LP CORT 3.5X42MM NS (Screw) ×1 IMPLANT
SPONGE T-LAP 18X18 ~~LOC~~+RFID (SPONGE) IMPLANT
SUCTION FRAZIER HANDLE 10FR (MISCELLANEOUS) ×1
SUCTION TUBE FRAZIER 10FR DISP (MISCELLANEOUS) ×1 IMPLANT
SUT ETHILON 3 0 PS 1 (SUTURE) ×3 IMPLANT
SUT VIC AB 0 CT1 27 (SUTURE) ×1
SUT VIC AB 0 CT1 27XBRD ANBCTR (SUTURE) ×1 IMPLANT
SUT VIC AB 2-0 CT1 27 (SUTURE) ×1
SUT VIC AB 2-0 CT1 TAPERPNT 27 (SUTURE) IMPLANT
SYR CONTROL 10ML LL (SYRINGE) IMPLANT
TOWEL GREEN STERILE (TOWEL DISPOSABLE) ×4 IMPLANT
TOWEL GREEN STERILE FF (TOWEL DISPOSABLE) ×2 IMPLANT
TUBE CONNECTING 12X1/4 (SUCTIONS) ×3 IMPLANT
UNDERPAD 30X36 HEAVY ABSORB (UNDERPADS AND DIAPERS) ×3 IMPLANT
WATER STERILE IRR 1000ML POUR (IV SOLUTION) ×1 IMPLANT

## 2021-08-10 NOTE — Progress Notes (Incomplete)
Pt states, It feels like the medicine is going to come back up" this RN turned the suction back on to LIS,

## 2021-08-10 NOTE — Anesthesia Procedure Notes (Signed)
Procedure Name: Intubation Date/Time: 08/10/2021 7:43 AM  Performed by: Audie Pinto, CRNAPre-anesthesia Checklist: Patient identified, Emergency Drugs available, Suction available and Patient being monitored Patient Re-evaluated:Patient Re-evaluated prior to induction Oxygen Delivery Method: Circle system utilized Preoxygenation: Pre-oxygenation with 100% oxygen Induction Type: IV induction, Rapid sequence and Cricoid Pressure applied Laryngoscope Size: Glidescope and 3 Grade View: Grade I Tube type: Oral Tube size: 7.0 mm Number of attempts: 1 Airway Equipment and Method: Stylet and Oral airway Placement Confirmation: ETT inserted through vocal cords under direct vision, positive ETCO2 and breath sounds checked- equal and bilateral Secured at: 22 cm Tube secured with: Tape Dental Injury: Teeth and Oropharynx as per pre-operative assessment
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
## 2021-08-10 NOTE — Interval H&P Note (Signed)
History and Physical Interval Note:  08/10/2021 7:26 AM  Shelley [name]  has presented today for surgery, with the diagnosis of Left calcaneus fracture/talus fracture.  The various methods of treatment have been discussed with the patient and family. After consideration of risks, benefits and other options for treatment, the patient has consented to  Procedure(s): OPEN REDUCTION INTERNAL FIXATION (ORIF) CALCANEOUS FRACTURE (Left) as a surgical intervention.  The patient's history has been reviewed, patient examined, no change in status, stable for surgery.  I have reviewed the patient's chart and labs.  Questions were answered to the patient's satisfaction.     Caryn Bee P Charese Abundis

## 2021-08-10 NOTE — Progress Notes (Signed)
Inpatient Rehab Admissions Coordinator:   Spoke with patient's father to confirm caregiver support, review goals/expectations of CIR stay, and review insurance auth process.  Will need updated therapy notes post-op for insurance and hope to open Monday if pt's ready.    Estill Dooms, PT, DPT Admissions Coordinator [phone] 08/10/21  3:24 PM
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
## 2021-08-10 NOTE — Progress Notes (Signed)
Ortho Trauma Note  Patient seen this morning.  Having some abdominal pain and lower extremity pain.  Has no questions about surgery.  We discussed risks and benefits of routine postoperative course.  She agreed to proceed with surgery consent was obtained.  We will plan for open reduction internal fixation of left calcaneus and talar neck fracture.  Roby Lofts, MD [facility] [phone] (office) [email or web address]

## 2021-08-10 NOTE — H&P (View-Only) (Signed)
Ortho Trauma Note  Patient seen this morning.  Having some abdominal pain and lower extremity pain.  Has no questions about surgery.  We discussed risks and benefits of routine postoperative course.  She agreed to proceed with surgery consent was obtained.  We will plan for open reduction internal fixation of left calcaneus and talar neck fracture.  Karthik Whittinghill P. Jonpaul Lumm, MD [facility] [phone] (office) [email or web address]  

## 2021-08-10 NOTE — Transfer of Care (Signed)
Immediate Anesthesia Transfer of Care Note  Patient: Shelley [name]  Procedure(s) Performed: OPEN REDUCTION INTERNAL FIXATION LEFT CALCANEOUS (Left: Foot)  Patient Location: PACU  Anesthesia Type:General  Level of Consciousness: drowsy and patient cooperative  Airway & Oxygen Therapy: Patient Spontanous Breathing and Patient connected to face mask oxygen  Post-op Assessment: Report given to RN and Post -op Vital signs reviewed and stable  Post vital signs: Reviewed and stable  Last Vitals:  Vitals Value Taken Time  BP 138/89 08/10/21 0934  Temp    Pulse 100 08/10/21 0938  Resp 17 08/10/21 0938  SpO2 97 % 08/10/21 0938  Vitals shown include unvalidated device data.  Last Pain:  Vitals:   08/10/21 0336  TempSrc:   PainSc: 7       Patients Stated Pain Goal: 0 (08/09/21 0800)  Complications: No notable events documented.

## 2021-08-11 ENCOUNTER — Encounter (HOSPITAL_COMMUNITY): Payer: Self-pay | Admitting: Student

## 2021-08-11 LAB — CBC
HCT: 27.4 % — ABNORMAL LOW (ref 36.0–46.0)
Hemoglobin: 9.3 g/dL — ABNORMAL LOW (ref 12.0–15.0)
MCH: 30.2 pg (ref 26.0–34.0)
MCHC: 33.9 g/dL (ref 30.0–36.0)
MCV: 89 fL (ref 80.0–100.0)
Platelets: 236 10*3/uL (ref 150–400)
RBC: 3.08 MIL/uL — ABNORMAL LOW (ref 3.87–5.11)
RDW: 13 % (ref 11.5–15.5)
WBC: 9.2 10*3/uL (ref 4.0–10.5)
nRBC: 0.5 % — ABNORMAL HIGH (ref 0.0–0.2)

## 2021-08-11 LAB — GLUCOSE, CAPILLARY
Glucose-Capillary: 89 mg/dL (ref 70–99)
Glucose-Capillary: 94 mg/dL (ref 70–99)
Glucose-Capillary: 99 mg/dL (ref 70–99)
Glucose-Capillary: 112 mg/dL — ABNORMAL HIGH (ref 70–99)
Glucose-Capillary: 119 mg/dL — ABNORMAL HIGH (ref 70–99)
Glucose-Capillary: 108 mg/dL — ABNORMAL HIGH (ref 70–99)

## 2021-08-11 MED ORDER — PHENOL 1.4 % MT LIQD: 1.0000 | OROMUCOSAL | Status: DC | PRN

## 2021-08-11 MED ORDER — PHENOL 1.4 % MT LIQD
1.0000 | OROMUCOSAL | Status: DC | PRN
  Filled 2021-08-11: qty 177
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
## 2021-08-12 LAB — GLUCOSE, CAPILLARY
Glucose-Capillary: 102 mg/dL — ABNORMAL HIGH (ref 70–99)
Glucose-Capillary: 105 mg/dL — ABNORMAL HIGH (ref 70–99)
Glucose-Capillary: 107 mg/dL — ABNORMAL HIGH (ref 70–99)
Glucose-Capillary: 109 mg/dL — ABNORMAL HIGH (ref 70–99)
Glucose-Capillary: 110 mg/dL — ABNORMAL HIGH (ref 70–99)
Glucose-Capillary: 88 mg/dL (ref 70–99)

## 2021-08-12 LAB — CBC
HCT: 27.3 % — ABNORMAL LOW (ref 36.0–46.0)
Hemoglobin: 9.2 g/dL — ABNORMAL LOW (ref 12.0–15.0)
MCH: 30.2 pg (ref 26.0–34.0)
MCHC: 33.7 g/dL (ref 30.0–36.0)
MCV: 89.5 fL (ref 80.0–100.0)
Platelets: 266 10*3/uL (ref 150–400)
RBC: 3.05 MIL/uL — ABNORMAL LOW (ref 3.87–5.11)
RDW: 13 % (ref 11.5–15.5)
WBC: 8.8 10*3/uL (ref 4.0–10.5)
nRBC: 0.3 % — ABNORMAL HIGH (ref 0.0–0.2)

## 2021-08-12 LAB — BASIC METABOLIC PANEL
Anion gap: 6 (ref 5–15)
BUN: 13 mg/dL (ref 6–20)
CO2: 28 mmol/L (ref 22–32)
Calcium: 8.2 mg/dL — ABNORMAL LOW (ref 8.9–10.3)
Chloride: 102 mmol/L (ref 98–111)
Creatinine, Ser: 0.48 mg/dL (ref 0.44–1.00)
GFR, Estimated: >60 mL/min (ref >60)
Glucose, Bld: 103 mg/dL — ABNORMAL HIGH (ref 70–99)
Potassium: 3.7 mmol/L (ref 3.5–5.1)
Sodium: 136 mmol/L (ref 135–145)
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
## 2021-08-12 NOTE — Progress Notes (Signed)
Pt tolerated meds down her NGT all night, no episodes of N/V, only complained of throat irritation which was resolved with chloraseptic spray

## 2021-08-13 ENCOUNTER — Other Ambulatory Visit: Payer: Self-pay

## 2021-08-13 LAB — BASIC METABOLIC PANEL
Anion gap: 13 (ref 5–15)
BUN: 9 mg/dL (ref 6–20)
CO2: 24 mmol/L (ref 22–32)
Calcium: 8.5 mg/dL — ABNORMAL LOW (ref 8.9–10.3)
Chloride: 98 mmol/L (ref 98–111)
Creatinine, Ser: 0.46 mg/dL (ref 0.44–1.00)
GFR, Estimated: >60 mL/min (ref >60)
Glucose, Bld: 97 mg/dL (ref 70–99)
Potassium: 4.1 mmol/L (ref 3.5–5.1)
Sodium: 135 mmol/L (ref 135–145)

## 2021-08-13 LAB — CBC
HCT: 27 % — ABNORMAL LOW (ref 36.0–46.0)
Hemoglobin: 9.4 g/dL — ABNORMAL LOW (ref 12.0–15.0)
MCH: 30.1 pg (ref 26.0–34.0)
MCHC: 34.8 g/dL (ref 30.0–36.0)
MCV: 86.5 fL (ref 80.0–100.0)
Platelets: 280 10*3/uL (ref 150–400)
RBC: 3.12 MIL/uL — ABNORMAL LOW (ref 3.87–5.11)
RDW: 13 % (ref 11.5–15.5)
WBC: 9.2 10*3/uL (ref 4.0–10.5)
nRBC: 0.2 % (ref 0.0–0.2)

## 2021-08-13 LAB — GLUCOSE, CAPILLARY
Glucose-Capillary: 96 mg/dL (ref 70–99)
Glucose-Capillary: 103 mg/dL — ABNORMAL HIGH (ref 70–99)
Glucose-Capillary: 126 mg/dL — ABNORMAL HIGH (ref 70–99)
Glucose-Capillary: 152 mg/dL — ABNORMAL HIGH (ref 70–99)
Glucose-Capillary: 115 mg/dL — ABNORMAL HIGH (ref 70–99)

## 2021-08-13 MED ORDER — OXYCODONE HCL 5 MG PO TABS
10.0000 mg | ORAL_TABLET | ORAL | Status: DC | PRN
  Administered 2021-08-14 (×2): 10 mg via ORAL
  Filled 2021-08-13 (×2): qty 2

## 2021-08-13 MED ORDER — METHOCARBAMOL 500 MG PO TABS
1000.0000 mg | ORAL_TABLET | Freq: Three times a day (TID) | ORAL | Status: DC
  Administered 2021-08-13 – 2021-08-28 (×45): 1000 mg via ORAL
  Filled 2021-08-13 (×45): qty 2

## 2021-08-13 MED ORDER — TRAMADOL HCL 50 MG PO TABS
50.0000 mg | ORAL_TABLET | Freq: Four times a day (QID) | ORAL | Status: DC | PRN
  Filled 2021-08-13: qty 1

## 2021-08-13 MED ORDER — METOCLOPRAMIDE HCL 5 MG/ML IJ SOLN
5.0000 mg | Freq: Three times a day (TID) | INTRAMUSCULAR | Status: DC | PRN
  Administered 2021-08-14: 10 mg via INTRAVENOUS
  Filled 2021-08-13: qty 2

## 2021-08-13 MED ORDER — PANTOPRAZOLE SODIUM 40 MG PO TBEC
40.0000 mg | DELAYED_RELEASE_TABLET | Freq: Every day | ORAL | Status: DC
Start: 2021-08-14 — End: 2021-08-28
  Administered 2021-08-14 – 2021-08-28 (×15): 40 mg via ORAL
  Filled 2021-08-13 (×16): qty 1

## 2021-08-13 MED ORDER — OXYCODONE HCL 5 MG PO TABS
5.0000 mg | ORAL_TABLET | ORAL | Status: DC | PRN
  Administered 2021-08-13: 5 mg via ORAL
  Filled 2021-08-13: qty 1

## 2021-08-13 MED ORDER — DOCUSATE SODIUM 100 MG PO CAPS
100.0000 mg | ORAL_CAPSULE | Freq: Two times a day (BID) | ORAL | Status: DC
  Administered 2021-08-13 – 2021-08-27 (×27): 100 mg via ORAL
  Filled 2021-08-13 (×30): qty 1

## 2021-08-13 MED ORDER — POLYETHYLENE GLYCOL 3350 17 G PO PACK
17.0000 g | PACK | Freq: Every day | ORAL | Status: DC | PRN
  Administered 2021-08-14: 17 g via ORAL
  Filled 2021-08-13: qty 1

## 2021-08-13 MED ORDER — ACETAMINOPHEN 500 MG PO TABS
1000.0000 mg | ORAL_TABLET | Freq: Four times a day (QID) | ORAL | Status: DC
Start: 2021-08-13 — End: 2021-08-26
  Administered 2021-08-13 – 2021-08-26 (×47): 1000 mg via ORAL
  Filled 2021-08-13 (×46): qty 2

## 2021-08-13 MED ORDER — CALCIUM CARBONATE 1250 (500 CA) MG PO TABS
500.0000 mg | ORAL_TABLET | Freq: Three times a day (TID) | ORAL | Status: DC
Start: 2021-08-13 — End: 2021-08-28
  Administered 2021-08-13 – 2021-08-28 (×45): 1250 mg via ORAL
  Filled 2021-08-13 (×45): qty 1

## 2021-08-13 MED ORDER — KETOROLAC TROMETHAMINE 15 MG/ML IJ SOLN
30.0000 mg | Freq: Four times a day (QID) | INTRAMUSCULAR | Status: AC
  Administered 2021-08-13 – 2021-08-18 (×20): 30 mg via INTRAVENOUS
  Filled 2021-08-13 (×20): qty 2

## 2021-08-13 MED ORDER — BETHANECHOL CHLORIDE 10 MG PO TABS
10.0000 mg | ORAL_TABLET | Freq: Three times a day (TID) | ORAL | Status: DC
  Administered 2021-08-13 – 2021-08-20 (×23): 10 mg via ORAL
  Filled 2021-08-13 (×23): qty 1

## 2021-08-13 MED ORDER — MORPHINE SULFATE (PF) 2 MG/ML IV SOLN: 2.0000 mg | INTRAVENOUS | Status: DC | PRN

## 2021-08-13 MED ORDER — METOCLOPRAMIDE HCL 5 MG PO TABS: 5.0000 mg | ORAL_TABLET | Freq: Three times a day (TID) | ORAL | Status: DC | PRN

## 2021-08-13 MED ORDER — GABAPENTIN 600 MG PO TABS
300.0000 mg | ORAL_TABLET | Freq: Three times a day (TID) | ORAL | Status: DC
  Administered 2021-08-13 – 2021-08-14 (×3): 300 mg via ORAL
  Filled 2021-08-13 (×3): qty 1
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
## 2021-08-13 NOTE — Progress Notes (Signed)
Inpatient Rehabilitation Admissions Coordinator   I met at bedside with patient . We again reviewed goals and expectations of a possible admit. I await updated PT and OT notes with transfers to begin Auth with [facility].  Danne Baxter, RN, MSN Rehab Admissions Coordinator [phone] 08/13/2021 2:58 PM
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
## 2021-08-13 NOTE — Plan of Care (Signed)
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
## 2021-08-13 NOTE — Anesthesia Postprocedure Evaluation (Signed)
Anesthesia Post Note  Patient: Shelley [name]  Procedure(s) Performed: OPEN REDUCTION INTERNAL FIXATION LEFT CALCANEOUS (Left: Foot)     Patient location during evaluation: PACU Anesthesia Type: General Level of consciousness: awake Pain management: pain level controlled Vital Signs Assessment: post-procedure vital signs reviewed and stable Respiratory status: spontaneous breathing and respiratory function stable Cardiovascular status: stable Postop Assessment: no apparent nausea or vomiting Anesthetic complications: no   No notable events documented.  Last Vitals:  Vitals:   08/13/21 0749 08/13/21 0754  BP:    Pulse:    Resp:  13  Temp: 36.9 C   SpO2:  98%    Last Pain:  Vitals:   08/13/21 0754  TempSrc:   PainSc: 4                  Candra R Donavan Foil

## 2021-08-13 NOTE — Plan of Care (Signed)
  Problem: Education: Goal: Knowledge of General Education information will improve Description: Including pain rating scale, medication(s)/side effects and non-pharmacologic comfort measures Outcome: Progressing   Problem: Skin Integrity: Goal: Risk for impaired skin integrity will decrease Outcome: Progressing   Problem: Tissue Perfusion: Goal: Ability to maintain adequate tissue perfusion will improve Outcome: Progressing

## 2021-08-14 DIAGNOSIS — T1490XA Injury, unspecified, initial encounter: Secondary | ICD-10-CM

## 2021-08-14 LAB — CBC
HCT: 28.1 % — ABNORMAL LOW (ref 36.0–46.0)
Hemoglobin: 9.8 g/dL — ABNORMAL LOW (ref 12.0–15.0)
MCH: 30.2 pg (ref 26.0–34.0)
MCHC: 34.9 g/dL (ref 30.0–36.0)
MCV: 86.7 fL (ref 80.0–100.0)
Platelets: 314 10*3/uL (ref 150–400)
RBC: 3.24 MIL/uL — ABNORMAL LOW (ref 3.87–5.11)
RDW: 13.3 % (ref 11.5–15.5)
WBC: 10.7 10*3/uL — ABNORMAL HIGH (ref 4.0–10.5)
nRBC: 0 % (ref 0.0–0.2)

## 2021-08-14 LAB — BASIC METABOLIC PANEL
Anion gap: 9 (ref 5–15)
BUN: 10 mg/dL (ref 6–20)
CO2: 27 mmol/L (ref 22–32)
Calcium: 8.6 mg/dL — ABNORMAL LOW (ref 8.9–10.3)
Chloride: 99 mmol/L (ref 98–111)
Creatinine, Ser: 0.57 mg/dL (ref 0.44–1.00)
GFR, Estimated: >60 mL/min (ref >60)
Glucose, Bld: 146 mg/dL — ABNORMAL HIGH (ref 70–99)
Potassium: 3.7 mmol/L (ref 3.5–5.1)
Sodium: 135 mmol/L (ref 135–145)

## 2021-08-14 LAB — GLUCOSE, CAPILLARY
Glucose-Capillary: 127 mg/dL — ABNORMAL HIGH (ref 70–99)
Glucose-Capillary: 106 mg/dL — ABNORMAL HIGH (ref 70–99)

## 2021-08-14 MED ORDER — HYDROXYZINE HCL 25 MG PO TABS
25.0000 mg | ORAL_TABLET | ORAL | Status: DC | PRN
  Administered 2021-08-21: 25 mg via ORAL
  Filled 2021-08-14: qty 1

## 2021-08-14 MED ORDER — TRAMADOL HCL 50 MG PO TABS
50.0000 mg | ORAL_TABLET | Freq: Four times a day (QID) | ORAL | Status: DC
  Administered 2021-08-14 – 2021-08-26 (×46): 50 mg via ORAL
  Filled 2021-08-14 (×47): qty 1

## 2021-08-14 MED ORDER — MORPHINE SULFATE (PF) 2 MG/ML IV SOLN: 2.0000 mg | INTRAVENOUS | Status: DC | PRN

## 2021-08-14 MED ORDER — MAGNESIUM HYDROXIDE 400 MG/5ML PO SUSP
30.0000 mL | Freq: Once | ORAL | Status: AC
Start: 2021-08-14 — End: 2021-08-14
  Administered 2021-08-14: 30 mL via ORAL
  Filled 2021-08-14: qty 30

## 2021-08-14 MED ORDER — GABAPENTIN 300 MG PO CAPS
300.0000 mg | ORAL_CAPSULE | Freq: Three times a day (TID) | ORAL | Status: DC
  Administered 2021-08-14 – 2021-08-17 (×11): 300 mg via ORAL
  Filled 2021-08-14 (×11): qty 1

## 2021-08-14 MED ORDER — ORAL CARE MOUTH RINSE: 15.0000 mL | OROMUCOSAL | Status: DC | PRN

## 2021-08-14 MED ORDER — OXYCODONE HCL 5 MG PO TABS
5.0000 mg | ORAL_TABLET | ORAL | Status: DC | PRN
  Administered 2021-08-14 – 2021-08-15 (×3): 5 mg via ORAL
  Filled 2021-08-14 (×3): qty 1
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
## 2021-08-14 NOTE — Progress Notes (Signed)
Inpatient Rehabilitation Admissions Coordinator   I will begin Auth with insurance for potential CIR admit this week.  Ottie Glazier, RN, MSN Rehab Admissions Coordinator [phone] 08/14/2021 12:26 PM

## 2021-08-14 NOTE — Progress Notes (Signed)
Foley catheter removed at this time, per order.   Robina Ade, RN

## 2021-08-14 NOTE — Progress Notes (Signed)
PT Cancellation Note  Patient Details Name: Shelley [name] MRN: [id number] DOB: 1985-02-07   Cancelled Treatment:    Reason Eval/Treat Not Completed: Pain limiting ability to participate (Pt refusing stating just got comfortable and denied participation at this time)   Lindsee Labarre B Yaphet Smethurst 08/14/2021, 8:18 AM Merryl Hacker, PT Acute Rehabilitation Services Office: [phone]
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
## 2021-08-15 LAB — CBC
HCT: 28.1 % — ABNORMAL LOW (ref 36.0–46.0)
Hemoglobin: 9.7 g/dL — ABNORMAL LOW (ref 12.0–15.0)
MCH: 30.1 pg (ref 26.0–34.0)
MCHC: 34.5 g/dL (ref 30.0–36.0)
MCV: 87.3 fL (ref 80.0–100.0)
Platelets: 315 10*3/uL (ref 150–400)
RBC: 3.22 MIL/uL — ABNORMAL LOW (ref 3.87–5.11)
RDW: 13.3 % (ref 11.5–15.5)
WBC: 12.8 10*3/uL — ABNORMAL HIGH (ref 4.0–10.5)
nRBC: 0.3 % — ABNORMAL HIGH (ref 0.0–0.2)

## 2021-08-15 LAB — BASIC METABOLIC PANEL
Anion gap: 8 (ref 5–15)
BUN: 9 mg/dL (ref 6–20)
CO2: 26 mmol/L (ref 22–32)
Calcium: 8.4 mg/dL — ABNORMAL LOW (ref 8.9–10.3)
Chloride: 101 mmol/L (ref 98–111)
Creatinine, Ser: 0.55 mg/dL (ref 0.44–1.00)
GFR, Estimated: >60 mL/min (ref >60)
Glucose, Bld: 92 mg/dL (ref 70–99)
Potassium: 4 mmol/L (ref 3.5–5.1)
Sodium: 135 mmol/L (ref 135–145)

## 2021-08-15 MED ORDER — MORPHINE SULFATE (PF) 2 MG/ML IV SOLN
2.0000 mg | INTRAVENOUS | Status: DC | PRN
  Administered 2021-08-15 – 2021-08-18 (×2): 4 mg via INTRAVENOUS
  Administered 2021-08-19 (×2): 2 mg via INTRAVENOUS
  Filled 2021-08-15: qty 1
  Filled 2021-08-15: qty 2
  Filled 2021-08-15: qty 1
  Filled 2021-08-15: qty 2

## 2021-08-15 MED ORDER — OXYCODONE HCL 5 MG PO TABS
5.0000 mg | ORAL_TABLET | ORAL | Status: DC | PRN
  Administered 2021-08-15 – 2021-08-18 (×7): 10 mg via ORAL
  Administered 2021-08-18: 5 mg via ORAL
  Administered 2021-08-19 (×2): 10 mg via ORAL
  Administered 2021-08-19: 5 mg via ORAL
  Administered 2021-08-19 – 2021-08-25 (×24): 10 mg via ORAL
  Filled 2021-08-15 (×12): qty 2
  Filled 2021-08-15: qty 1
  Filled 2021-08-15 (×24): qty 2

## 2021-08-15 NOTE — Progress Notes (Incomplete)
Inpatient Rehabilitation Admission Medication Review by a Pharmacist  A complete drug regimen review was completed for this patient to identify any potential clinically significant medication issues.  High Risk Drug Classes Is patient taking? Indication by Medication  Antipsychotic Yes Compazine, phenergan- N/V  Anticoagulant Yes Lovenox- VTE prophylaxis  Antibiotic No   Opioid Yes Tramadol, OxyIR- acute pain  Antiplatelet No   Hypoglycemics/insulin No   Vasoactive Medication No   Chemotherapy No   Other Yes Gabapentin- neuropathic pain Robaxin- muscle spasms Protonix- GERD Vitamin D- low vitamin D level     Type of Medication Issue Identified Description of Issue Recommendation(s)  Drug Interaction(s) (clinically significant)     Duplicate Therapy     Allergy     No Medication Administration End Date     Incorrect Dose     Additional Drug Therapy Needed     Significant med changes from prior encounter (inform family/care partners about these prior to discharge).    Other  PTA meds: Bentyl 10 mg bid prn cramping Pepcid 40 mg bid Restart PTA meds when and if necessary during CIR admission or at time of discharge, if warranted    Clinically significant medication issues were identified that warrant physician communication and completion of prescribed/recommended actions by midnight of the next day:  No  Time spent performing this drug regimen review (minutes):  30  Guerline Happ BS, PharmD, BCPS Clinical Pharmacist 08/15/2021 12:10 PM  Contact: [phone] after 3 PM  "Be curious, not judgmental..." -Debbora Dus
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
## 2021-08-15 NOTE — Progress Notes (Signed)
Inpatient Rehabilitation Admissions Coordinator   I met at bedside with patient . I await insurance approval for possible Cir admit. She is in agreement.   Danne Baxter, RN, MSN Rehab Admissions Coordinator [phone] 08/15/2021 11:25 AM

## 2021-08-16 MED ORDER — PRAZOSIN HCL 1 MG PO CAPS
1.0000 mg | ORAL_CAPSULE | Freq: Every day | ORAL | Status: DC
  Administered 2021-08-16: 1 mg via ORAL
  Filled 2021-08-16: qty 1

## 2021-08-16 MED ORDER — MELATONIN 3 MG PO TABS
3.0000 mg | ORAL_TABLET | Freq: Every day | ORAL | Status: DC
  Administered 2021-08-16 – 2021-08-27 (×12): 3 mg via ORAL
  Filled 2021-08-16 (×13): qty 1

## 2021-08-16 MED ORDER — MAGNESIUM HYDROXIDE 400 MG/5ML PO SUSP
30.0000 mL | Freq: Once | ORAL | Status: DC
Start: 2021-08-16 — End: 2021-08-26

## 2021-08-16 MED ORDER — POLYETHYLENE GLYCOL 3350 17 G PO PACK
17.0000 g | PACK | Freq: Every day | ORAL | Status: DC
  Administered 2021-08-18 – 2021-08-27 (×6): 17 g via ORAL
  Filled 2021-08-16 (×9): qty 1
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
## 2021-08-16 NOTE — Progress Notes (Signed)
   Inpatient Rehabilitation Admissions Coordinator    [place] medicaid will not approve out of network AIR level rehab in [place]. They will approve in network AIR level rehab in [place], preferably in [place]. I met with patient at bedside and she is aware. The CM at [facility] is Tammy with phone [phone]. I have alerted acute team and TOC. We will sign off.  Danne Baxter, RN, MSN Rehab Admissions Coordinator [phone] 08/16/2021 1:22 PM

## 2021-08-16 NOTE — TOC Progression Note (Signed)
Transition of Care [facility]) - Progression Note    Patient Details  Name: Shelley [name] MRN: [id number] Date of Birth: 1985-08-09  [facility] CM/SW Contact  Oren Section Cleta Alberts, RN Phone Number: 08/16/2021, 3:16 PM  Clinical Narrative:    Unfortunately,  VA Medicaid will not approve out of network inpatient rehab in [place].  Met with patient and discussed options in [place], and she prefers [facility], if possible.  Referral faxed to [facility] in admissions at [facility], fax number [phone].  Will provide updates as they are available.   Expected Discharge Plan: IP Rehab Facility Barriers to Discharge: Continued Medical Work up, Ship broker  Expected Discharge Plan and Services Expected Discharge Plan: [place]   Discharge Planning Services: CM Consult Post Acute Care Choice: IP Rehab Living arrangements for the past 2 months: Apartment                                       Social Determinants of Health (SDOH) Interventions    Readmission Risk Interventions     No data to display         Reinaldo Raddle, RN, BSN  Trauma/Neuro ICU Case Manager [phone]
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
## 2021-08-18 MED ORDER — GABAPENTIN 400 MG PO CAPS
400.0000 mg | ORAL_CAPSULE | Freq: Three times a day (TID) | ORAL | Status: DC
  Administered 2021-08-18 – 2021-08-28 (×31): 400 mg via ORAL
  Filled 2021-08-18 (×31): qty 1
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
## 2021-08-20 MED ORDER — MORPHINE SULFATE (PF) 2 MG/ML IV SOLN
2.0000 mg | Freq: Four times a day (QID) | INTRAVENOUS | Status: DC | PRN
  Administered 2021-08-20 – 2021-08-22 (×2): 2 mg via INTRAVENOUS
  Filled 2021-08-20 (×2): qty 1

## 2021-08-20 MED ORDER — IBUPROFEN 200 MG PO TABS
600.0000 mg | ORAL_TABLET | Freq: Four times a day (QID) | ORAL | Status: DC
  Administered 2021-08-20 – 2021-08-25 (×23): 600 mg via ORAL
  Filled 2021-08-20 (×25): qty 3
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
## 2021-08-20 NOTE — Discharge Instructions (Addendum)
Orthopaedic Trauma Service Discharge Instructions   General Discharge Instructions  WEIGHT BEARING STATUS: Nonweightbearing bilateral lower extremities  RANGE OF MOTION/ACTIVITY: Okay for unrestricted range of motion of bilateral hips and knees.  Ok to discontinue splint RLE and begin gentle ankle range of motion.  Okay for gentle left ankle range of motion.  Have CAM boot in place on LLE when out of bed  Wound Care: Clean incisions daily with soap and water. Incisions can be left open to air if there is no drainage.  These incisions may get wet when showering, should be cleaned with warm water and soap. Maintain sutures to right medial heel as well as pins to top of right foot until seen in follow-up by orthopedics.  DVT/PE prophylaxis:  Eliquis 2.5 mg twice daily x30 days  Diet: as you were eating previously.  Can use over the counter stool softeners and bowel preparations, such as Miralax, to help with bowel movements.  Narcotics can be constipating.  Be sure to drink plenty of fluids  PAIN MEDICATION USE AND EXPECTATIONS  You have likely been given narcotic medications to help control your pain.  After a traumatic event that results in an fracture (broken bone) with or without surgery, it is ok to use narcotic pain medications to help control one's pain.  We understand that everyone responds to pain differently and each individual patient will be evaluated on a regular basis for the continued need for narcotic medications. Ideally, narcotic medication use should last no more than 6-8 weeks (coinciding with fracture healing).   As a patient it is your responsibility as well to monitor narcotic medication use and report the amount and frequency you use these medications when you come to your office visit.   We would also advise that if you are using narcotic medications, you should take a dose prior to therapy to maximize you participation.  IF YOU ARE ON NARCOTIC MEDICATIONS IT IS NOT  PERMISSIBLE TO OPERATE A MOTOR VEHICLE (MOTORCYCLE/CAR/TRUCK/MOPED) OR HEAVY MACHINERY DO NOT MIX NARCOTICS WITH OTHER CNS (CENTRAL NERVOUS SYSTEM) DEPRESSANTS SUCH AS ALCOHOL   STOP SMOKING OR USING NICOTINE PRODUCTS!!!!  As discussed nicotine severely impairs your body's ability to heal surgical and traumatic wounds but also impairs bone healing.  Wounds and bone heal by forming microscopic blood vessels (angiogenesis) and nicotine is a vasoconstrictor (essentially, shrinks blood vessels).  Therefore, if vasoconstriction occurs to these microscopic blood vessels they essentially disappear and are unable to deliver necessary nutrients to the healing tissue.  This is one modifiable factor that you can do to dramatically increase your chances of healing your injury.    (This means no smoking, no nicotine gum, patches, etc)  DO NOT USE NONSTEROIDAL ANTI-INFLAMMATORY DRUGS (NSAID'S)  Using products such as Advil (ibuprofen), Aleve (naproxen), Motrin (ibuprofen) for additional pain control during fracture healing can delay and/or prevent the healing response.  If you would like to take over the counter (OTC) medication, Tylenol (acetaminophen) is ok.  However, some narcotic medications that are given for pain control contain acetaminophen as well. Therefore, you should not exceed more than 4000 mg of tylenol in a day if you do not have liver disease.  Also note that there are may OTC medicines, such as cold medicines and allergy medicines that my contain tylenol as well.  If you have any questions about medications and/or interactions please ask your doctor/PA or your pharmacist.      ICE AND ELEVATE INJURED/OPERATIVE EXTREMITY  Using ice  and elevating the injured extremity above your heart can help with swelling and pain control.  Icing in a pulsatile fashion, such as 20 minutes on and 20 minutes off, can be followed.    Do not place ice directly on skin. Make sure there is a barrier between to skin and  the ice pack.    Using frozen items such as frozen peas works well as the conform nicely to the are that needs to be iced.  USE AN ACE WRAP OR TED HOSE FOR SWELLING CONTROL  In addition to icing and elevation, Ace wraps or TED hose are used to help limit and resolve swelling.  It is recommended to use Ace wraps or TED hose until you are informed to stop.    When using Ace Wraps start the wrapping distally (farthest away from the body) and wrap proximally (closer to the body)   Example: If you had surgery on your leg or thing and you do not have a splint on, start the ace wrap at the toes and work your way up to the thigh        If you had surgery on your upper extremity and do not have a splint on, start the ace wrap at your fingers and work your way up to the upper arm   IF YOU ARE IN A CAM BOOT (BLACK BOOT)  You may remove boot periodically. Perform daily dressing changes as noted below.  Wash the liner of the boot regularly and wear a sock when wearing the boot. It is recommended that you sleep in the boot until told otherwise   CALL THE OFFICE WITH ANY QUESTIONS OR CONCERNS: [phone]   VISIT OUR WEBSITE FOR ADDITIONAL INFORMATION: [email or web address]      Discharge Pin Site Instructions  Dress pins daily with Kerlix roll starting on POD 2. Wrap the Kerlix so that it tamps the skin down around the pin-skin interface to prevent/limit motion of the skin relative to the pin.  (Pin-skin motion is the primary cause of pain and infection related to external fixator pin sites).  Remove any crust or coagulum that may obstruct drainage with a saline moistened gauze or soap and water.  After POD 3, if there is no discernable drainage on the pin site dressing, the interval for change can by increased to every other day.  You may shower with the fixator, cleaning all pin sites gently with soap and water.  If you have a surgical wound this needs to be completely dry and without drainage before  showering.  The extremity can be lifted by the fixator to facilitate wound care and transfers.  Notify the office/Doctor if you experience increasing drainage, redness, or pain from a pin site, or if you notice purulent (thick, snot-like) drainage.  Discharge Wound Care Instructions  Do NOT apply any ointments, solutions or lotions to pin sites or surgical wounds.  These prevent needed drainage and even though solutions like hydrogen peroxide kill bacteria, they also damage cells lining the pin sites that help fight infection.  Applying lotions or ointments can keep the wounds moist and can cause them to breakdown and open up as well. This can increase the risk for infection. When in doubt call the office.  If any drainage is noted, use one layer of adaptic or Mepitel, then gauze, Kerlix, and an ace wrap. - These dressing supplies should be available at local medical supply stores [place], [facility], etc) as well as Insurance claims handler ([employer],  [facility], [employer], etc)  Once the incision is completely dry and without drainage, it may be left open to air out.  Showering may begin 36-48 hours later.  Cleaning gently with soap and water.  Traumatic wounds should be dressed daily as well.    One layer of adaptic, gauze, Kerlix, then ace wrap.  The adaptic can be discontinued once the draining has ceased    If you have a wet to dry dressing: wet the gauze with saline the squeeze as much saline out so the gauze is moist (not soaking wet), place moistened gauze over wound, then place a dry gauze over the moist one, followed by Kerlix wrap, then ace wrap.

## 2021-08-20 NOTE — TOC Progression Note (Addendum)
Transition of Care [facility]) - Progression Note    Patient Details  Name: Riddhi Grether MRN: [id number] Date of Birth: 1986/01/30  Transition [facility] CM/SW Contact  Astrid Drafts Berna Spare, RN Phone Number: 08/20/2021, 10:21 AM  Clinical Narrative:    Sherron Monday with Kennyth Arnold at [facility] for update on referral faxed 08/16/2021.  She states she has not received this fax, though it was sent to correct number.  Refaxed referral to [phone].  Will await follow up from [facility].  Addendum: 1600 Telephone call to [facility] inpatient re[facility]: She states she has received patient clinicals via fax, and case is in review currently with MD.  She states she will follow-up with me in the a.m. for update.  Patient notified of progress with referral.   Expected Discharge Plan: IP Rehab Facility Barriers to Discharge: Continued Medical Work up, English as a second language teacher  Expected Discharge Plan and Services Expected Discharge Plan: IP Rehab Facility   Discharge Planning Services: CM Consult Post Acute Care Choice: IP Rehab Living arrangements for the past 2 months: Apartment                                       Social Determinants of Health (SDOH) Interventions    Readmission Risk Interventions     No data to display         Quintella Baton, RN, BSN  Trauma/Neuro ICU Case Manager [phone]
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
## 2021-08-21 MED ORDER — BETHANECHOL CHLORIDE 10 MG PO TABS
5.0000 mg | ORAL_TABLET | Freq: Three times a day (TID) | ORAL | Status: DC
  Administered 2021-08-21 – 2021-08-28 (×22): 5 mg via ORAL
  Filled 2021-08-21 (×22): qty 1

## 2021-08-21 MED ORDER — GUAIFENESIN ER 600 MG PO TB12
600.0000 mg | ORAL_TABLET | Freq: Two times a day (BID) | ORAL | Status: DC
  Administered 2021-08-21 – 2021-08-28 (×15): 600 mg via ORAL
  Filled 2021-08-21 (×15): qty 1
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
## 2021-08-21 NOTE — TOC Progression Note (Addendum)
Transition of Care [facility]) - Progression Note    Patient Details  Name: Shelley [name] MRN: [id number] Date of Birth: 24-Nov-1985  [facility]) CM/SW Contact  Astrid Drafts Berna Spare, RN Phone Number: 08/21/2021, 11:57am  Clinical Narrative:    Telephone call to Kennyth Arnold in admissions at [facility] in [place]; she states that the physician is still reviewing the referral, and she will notify me when approved.  After MD approves, admissions department to initiate insurance authorization.  Will await follow-up.  Addendum: 3:54 PM Call made to [facility] in admissions at [facility]; receptionist states she is gone for the day, and there is no one to give an update.  She will try to reach her, and have her call back.   Addendum: 4:16pm Received call back from Stacy in admissions at [facility]; patient has been approved by MD for admit.  She will begin insurance auth in AM.   Expected Discharge Plan: IP Rehab Facility Barriers to Discharge: Continued Medical Work up, English as a second language teacher  Expected Discharge Plan and Services Expected Discharge Plan: IP Rehab Facility   Discharge Planning Services: CM Consult Post Acute Care Choice: IP Rehab Living arrangements for the past 2 months: Apartment                                       Social Determinants of Health (SDOH) Interventions    Readmission Risk Interventions     No data to display         Quintella Baton, RN, BSN  Trauma/Neuro ICU Case Manager [phone]
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
## 2021-08-21 NOTE — Plan of Care (Signed)
  Problem: Education: Goal: Knowledge of General Education information will improve Description: Including pain rating scale, medication(s)/side effects and non-pharmacologic comfort measures Outcome: Progressing   Problem: Health Behavior/Discharge Planning: Goal: Ability to manage health-related needs will improve Outcome: Progressing   Problem: Clinical Measurements: Goal: Ability to maintain clinical measurements within normal limits will improve Outcome: Progressing Goal: Will remain free from infection Outcome: Progressing Goal: Diagnostic test results will improve Outcome: Progressing Goal: Respiratory complications will improve Outcome: Progressing Goal: Cardiovascular complication will be avoided Outcome: Progressing   Problem: Activity: Goal: Risk for activity intolerance will decrease Outcome: Progressing   Problem: Nutrition: Goal: Adequate nutrition will be maintained Outcome: Progressing   Problem: Coping: Goal: Level of anxiety will decrease Outcome: Progressing   Problem: Elimination: Goal: Will not experience complications related to bowel motility Outcome: Progressing Goal: Will not experience complications related to urinary retention Outcome: Progressing   Problem: Pain Managment: Goal: General experience of comfort will improve Outcome: Progressing   Problem: Safety: Goal: Ability to remain free from injury will improve Outcome: Progressing   Problem: Skin Integrity: Goal: Risk for impaired skin integrity will decrease Outcome: Progressing   Problem: Activity: Goal: Ability to avoid complications of mobility impairment will improve Outcome: Progressing Goal: Ability to tolerate increased activity will improve Outcome: Progressing   Problem: Education: Goal: Verbalization of understanding the information provided will improve Outcome: Progressing   Problem: Coping: Goal: Level of anxiety will decrease Outcome: Progressing   Problem:  Physical Regulation: Goal: Postoperative complications will be avoided or minimized Outcome: Progressing   Problem: Respiratory: Goal: Ability to maintain a clear airway will improve Outcome: Progressing   Problem: Pain Management: Goal: Pain level will decrease Outcome: Progressing   Problem: Skin Integrity: Goal: Signs of wound healing will improve Outcome: Progressing   Problem: Tissue Perfusion: Goal: Ability to maintain adequate tissue perfusion will improve Outcome: Progressing   
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
## 2021-08-22 NOTE — TOC Progression Note (Signed)
Transition of Care [facility]) - Progression Note    Patient Details  Name: Shelley [name] MRN: [id number] Date of Birth: 1985/04/23  Transition of Care [facility]) CM/SW Contact  Glennon Mac, RN Phone Number: 08/22/2021,  3:00pm  Clinical Narrative:    Sherron Monday with Kennyth Arnold at [facility] inpatient rehab; she states that facility has no available bed at this time, but she will initiate insurance authorization for admission.  She is uncertain of when patient could be admitted, stating that it possibly could be early next week, due to issues with nursing staffing.  She plans to check with her supervisor in the AM to see if patient can be admitted any earlier. I visited patient, and provided update on inpatient rehab situation.  Patient became very emotional and upset, stating that she has been here for over 2 weeks, and feels like she cannot stay here anymore.  Offered to pursue other rehab venues, such as inpatient rehab in [place], [place] or skilled nursing facilities in the [place] area.  She states that [facility] is too far, but may be open to looking at a SNF, if it is going to take much longer to get to [facility] inpatient rehab.  Will follow-up with Stacy in [facility] admissions in a.m. regarding insurance auth and timing of admission.   Expected Discharge Plan: IP Rehab Facility Barriers to Discharge: Continued Medical Work up, English as a second language teacher  Expected Discharge Plan and Services Expected Discharge Plan: IP Rehab Facility   Discharge Planning Services: CM Consult Post Acute Care Choice: IP Rehab Living arrangements for the past 2 months: Apartment                                       Social Determinants of Health (SDOH) Interventions    Readmission Risk Interventions     No data to display         Quintella Baton, RN, BSN  Trauma/Neuro ICU Case Manager [phone]
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
## 2021-08-23 NOTE — Plan of Care (Signed)
  Problem: Education: Goal: Knowledge of General Education information will improve Description: Including pain rating scale, medication(s)/side effects and non-pharmacologic comfort measures Outcome: Progressing   Problem: Health Behavior/Discharge Planning: Goal: Ability to manage health-related needs will improve Outcome: Progressing   Problem: Clinical Measurements: Goal: Ability to maintain clinical measurements within normal limits will improve Outcome: Progressing Goal: Will remain free from infection Outcome: Progressing Goal: Diagnostic test results will improve Outcome: Progressing Goal: Respiratory complications will improve Outcome: Progressing Goal: Cardiovascular complication will be avoided Outcome: Progressing   Problem: Activity: Goal: Risk for activity intolerance will decrease Outcome: Progressing   Problem: Nutrition: Goal: Adequate nutrition will be maintained Outcome: Progressing   Problem: Coping: Goal: Level of anxiety will decrease Outcome: Progressing   Problem: Elimination: Goal: Will not experience complications related to bowel motility Outcome: Progressing Goal: Will not experience complications related to urinary retention Outcome: Progressing   Problem: Pain Managment: Goal: General experience of comfort will improve Outcome: Progressing   Problem: Safety: Goal: Ability to remain free from injury will improve Outcome: Progressing   Problem: Skin Integrity: Goal: Risk for impaired skin integrity will decrease Outcome: Progressing   Problem: Activity: Goal: Ability to avoid complications of mobility impairment will improve Outcome: Progressing Goal: Ability to tolerate increased activity will improve Outcome: Progressing   Problem: Education: Goal: Verbalization of understanding the information provided will improve Outcome: Progressing   Problem: Coping: Goal: Level of anxiety will decrease Outcome: Progressing   Problem:  Physical Regulation: Goal: Postoperative complications will be avoided or minimized Outcome: Progressing   Problem: Respiratory: Goal: Ability to maintain a clear airway will improve Outcome: Progressing   Problem: Pain Management: Goal: Pain level will decrease Outcome: Progressing   Problem: Skin Integrity: Goal: Signs of wound healing will improve Outcome: Progressing   Problem: Tissue Perfusion: Goal: Ability to maintain adequate tissue perfusion will improve Outcome: Progressing   

## 2021-08-23 NOTE — TOC Progression Note (Signed)
Transition of Care [facility]) - Progression Note    Patient Details  Name: Shelley [name] MRN: [id number] Date of Birth: [birth year]  Transition of Care [facility]) CM/SW Contact  Glennon Mac, RN Phone Number: 08/23/2021, 10:28am  Clinical Narrative:    Sherron Monday with Stacy in admissions at [facility] inpatient rehab: She states that admissions are currently on hold due to staffing issues with nursing.   Insurance authorization is still pending for [employer].  Facility's next discharge is planned for July 25, per admissions director.  Hopeful for discharge to [facility] in [place] early next week pending bed availability and insurance approval.  Expected Discharge Plan: IP Rehab Facility Barriers to Discharge: Continued Medical Work up, English as a second language teacher  Expected Discharge Plan and Services Expected Discharge Plan: IP Rehab Facility   Discharge Planning Services: CM Consult Post Acute Care Choice: IP Rehab Living arrangements for the past 2 months: Apartment                                       Social Determinants of Health (SDOH) Interventions    Readmission Risk Interventions     No data to display         Quintella Baton, RN, BSN  Trauma/Neuro ICU Case Manager [phone]
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
## 2021-08-26 MED ORDER — TRAMADOL HCL 50 MG PO TABS
50.0000 mg | ORAL_TABLET | Freq: Four times a day (QID) | ORAL | Status: DC | PRN
  Administered 2021-08-26 – 2021-08-28 (×6): 50 mg via ORAL
  Filled 2021-08-26 (×6): qty 1

## 2021-08-26 MED ORDER — OXYCODONE HCL 5 MG PO TABS
5.0000 mg | ORAL_TABLET | Freq: Four times a day (QID) | ORAL | Status: DC | PRN
  Administered 2021-08-26 – 2021-08-27 (×5): 10 mg via ORAL
  Filled 2021-08-26 (×4): qty 2

## 2021-08-26 MED ORDER — IBUPROFEN 200 MG PO TABS
600.0000 mg | ORAL_TABLET | Freq: Four times a day (QID) | ORAL | Status: DC | PRN
  Administered 2021-08-26 – 2021-08-28 (×5): 600 mg via ORAL
  Filled 2021-08-26 (×4): qty 3

## 2021-08-26 MED ORDER — ACETAMINOPHEN 500 MG PO TABS
1000.0000 mg | ORAL_TABLET | Freq: Four times a day (QID) | ORAL | Status: DC | PRN
  Administered 2021-08-26 – 2021-08-27 (×3): 1000 mg via ORAL
  Filled 2021-08-26 (×3): qty 2
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
## 2021-08-27 ENCOUNTER — Inpatient Hospital Stay (HOSPITAL_COMMUNITY): Payer: Medicaid Other

## 2021-08-27 MED ORDER — OXYCODONE HCL 5 MG PO TABS
5.0000 mg | ORAL_TABLET | ORAL | Status: DC | PRN
  Administered 2021-08-27 – 2021-08-28 (×4): 10 mg via ORAL
  Filled 2021-08-27 (×4): qty 2

## 2021-08-27 NOTE — Plan of Care (Signed)
  Problem: Education: Goal: Knowledge of General Education information will improve Description: Including pain rating scale, medication(s)/side effects and non-pharmacologic comfort measures Outcome: Progressing   Problem: Health Behavior/Discharge Planning: Goal: Ability to manage health-related needs will improve Outcome: Progressing   Problem: Clinical Measurements: Goal: Ability to maintain clinical measurements within normal limits will improve Outcome: Progressing Goal: Will remain free from infection Outcome: Progressing Goal: Diagnostic test results will improve Outcome: Progressing Goal: Respiratory complications will improve Outcome: Progressing Goal: Cardiovascular complication will be avoided Outcome: Progressing   Problem: Activity: Goal: Risk for activity intolerance will decrease Outcome: Not Progressing   Problem: Nutrition: Goal: Adequate nutrition will be maintained Outcome: Progressing   Problem: Coping: Goal: Level of anxiety will decrease Outcome: Progressing   Problem: Elimination: Goal: Will not experience complications related to bowel motility Outcome: Progressing Goal: Will not experience complications related to urinary retention Outcome: Progressing   Problem: Pain Managment: Goal: General experience of comfort will improve Outcome: Progressing   Problem: Safety: Goal: Ability to remain free from injury will improve Outcome: Progressing   Problem: Skin Integrity: Goal: Risk for impaired skin integrity will decrease Outcome: Progressing   Problem: Activity: Goal: Ability to avoid complications of mobility impairment will improve Outcome: Progressing Goal: Ability to tolerate increased activity will improve Outcome: Progressing   Problem: Education: Goal: Verbalization of understanding the information provided will improve Outcome: Progressing   Problem: Coping: Goal: Level of anxiety will decrease Outcome: Progressing    Problem: Physical Regulation: Goal: Postoperative complications will be avoided or minimized Outcome: Progressing   Problem: Respiratory: Goal: Ability to maintain a clear airway will improve Outcome: Progressing   Problem: Pain Management: Goal: Pain level will decrease Outcome: Progressing   Problem: Skin Integrity: Goal: Signs of wound healing will improve Outcome: Progressing   Problem: Tissue Perfusion: Goal: Ability to maintain adequate tissue perfusion will improve Outcome: Progressing
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
## 2021-08-27 NOTE — TOC Progression Note (Signed)
Transition of Care [facility]) - Progression Note    Patient Details  Name: Shelley [name] MRN: [id number] Date of Birth: 06/10/1985  Transition of Care [facility]) CM/SW Contact  Glennon Mac, RN Phone Number: 08/27/2021, 4:53 PM  Clinical Narrative:    Patient has been accepted for admission tomorrow to [facility].  Facility prefers that patient arrive in AM; will call for PTAR transport first thing in AM.  Trauma team and patient updated; patient agreeable to transfer to facility tomorrow.    Expected Discharge Plan: IP Rehab Facility Barriers to Discharge: Barriers Resolved  Expected Discharge Plan and Services Expected Discharge Plan: IP Rehab Facility   Discharge Planning Services: CM Consult Post Acute Care Choice: IP Rehab Living arrangements for the past 2 months: Apartment                                       Social Determinants of Health (SDOH) Interventions    Readmission Risk Interventions     No data to display         Quintella Baton, RN, BSN  Trauma/Neuro ICU Case Manager [phone]

## 2021-08-27 NOTE — TOC Progression Note (Signed)
Transition of Care [facility]) - Progression Note    Patient Details  Name: Shelley [name] MRN: [id number] Date of Birth: 13-Dec-1985  Transition of Care [facility]) CM/SW Contact  Astrid Drafts Berna Spare, RN Phone Number: 08/27/2021, 9:53 AM  Clinical Narrative:    Sherron Monday with Stacy at [facility] in [place]; she states [place] was [place] on Friday, and she was unable to submit for auth.  She plans to submit for auth this morning, and provide updates to me as available.    Expected Discharge Plan: IP Rehab Facility Barriers to Discharge: Continued Medical Work up, English as a second language teacher  Expected Discharge Plan and Services Expected Discharge Plan: IP Rehab Facility   Discharge Planning Services: CM Consult Post Acute Care Choice: IP Rehab Living arrangements for the past 2 months: Apartment                                       Social Determinants of Health (SDOH) Interventions    Readmission Risk Interventions     No data to display         Quintella Baton, RN, BSN  Trauma/Neuro ICU Case Manager [phone]

## 2021-08-27 NOTE — Progress Notes (Signed)
PT Cancellation Note  Patient Details Name: Shelley [name] MRN: [id number] DOB: 06-27-1985   Cancelled Treatment:    Reason Eval/Treat Not Completed: Fatigue/lethargy limiting ability to participate;Patient declined, no reason specified;Other (comment). Attempted to see pt for PT session 3x this afternoon, but each time pt was either upset and requesting time to herself, in pain and tired wanting to nap first, or sleeping and not wanting to be disturbed. Will plan to attempt again tomorrow as able. Notified RN of pt's depression and being upset today.   Raymond Gurney, PT, DPT Acute Rehabilitation Services  Office: [phone]    Jewel Baize 08/27/2021, 5:26 PM
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
## 2021-08-28 MED ORDER — OXYCODONE HCL 5 MG PO TABS: 5.0000 mg | ORAL_TABLET | ORAL | 0 refills | Status: DC | PRN

## 2021-08-28 MED ORDER — ONDANSETRON 4 MG PO TBDP: 4.0000 mg | ORAL_TABLET | Freq: Four times a day (QID) | ORAL | 0 refills | Status: DC | PRN

## 2021-08-28 MED ORDER — ORAL CARE MOUTH RINSE: 15.0000 mL | OROMUCOSAL | 0 refills | Status: DC | PRN

## 2021-08-28 MED ORDER — HYDROMORPHONE HCL 1 MG/ML IJ SOLN
1.0000 mg | INTRAMUSCULAR | Status: DC
  Filled 2021-08-28: qty 1

## 2021-08-28 MED ORDER — GABAPENTIN 400 MG PO CAPS: 400.0000 mg | ORAL_CAPSULE | Freq: Three times a day (TID) | ORAL | Status: DC

## 2021-08-28 MED ORDER — BISACODYL 10 MG RE SUPP: 10.0000 mg | Freq: Every day | RECTAL | 0 refills | Status: DC

## 2021-08-28 MED ORDER — ACETAMINOPHEN 500 MG PO TABS
1000.0000 mg | ORAL_TABLET | Freq: Four times a day (QID) | ORAL | 0 refills | Status: AC | PRN
Start: 2021-08-28 — End: ?

## 2021-08-28 MED ORDER — VITAMIN D (ERGOCALCIFEROL) 1.25 MG (50000 UNIT) PO CAPS: 50000.0000 [IU] | ORAL_CAPSULE | ORAL | Status: DC

## 2021-08-28 MED ORDER — IBUPROFEN 600 MG PO TABS: 600.0000 mg | ORAL_TABLET | Freq: Four times a day (QID) | ORAL | 0 refills | Status: AC | PRN

## 2021-08-28 MED ORDER — METOCLOPRAMIDE HCL 5 MG PO TABS: 5.0000 mg | ORAL_TABLET | Freq: Three times a day (TID) | ORAL | Status: DC | PRN

## 2021-08-28 MED ORDER — METHOCARBAMOL 1000 MG PO TABS: 1000.0000 mg | ORAL_TABLET | Freq: Three times a day (TID) | ORAL | Status: DC

## 2021-08-28 MED ORDER — POLYETHYLENE GLYCOL 3350 17 G PO PACK: 17.0000 g | PACK | Freq: Every day | ORAL | 0 refills | Status: DC

## 2021-08-28 MED ORDER — MELATONIN 3 MG PO TABS: 3.0000 mg | ORAL_TABLET | Freq: Every day | ORAL | 0 refills | Status: DC

## 2021-08-28 MED ORDER — DOCUSATE SODIUM 100 MG PO CAPS: 100.0000 mg | ORAL_CAPSULE | Freq: Two times a day (BID) | ORAL | 0 refills | Status: DC

## 2021-08-28 MED ORDER — ENOXAPARIN SODIUM 30 MG/0.3ML IJ SOSY: 30.0000 mg | PREFILLED_SYRINGE | Freq: Two times a day (BID) | INTRAMUSCULAR | Status: DC

## 2021-08-28 MED ORDER — MENTHOL 3 MG MT LOZG: 1.0000 | LOZENGE | OROMUCOSAL | 12 refills | Status: DC | PRN

## 2021-08-28 MED ORDER — CALCIUM CARBONATE 1250 (500 CA) MG PO TABS: 1250.0000 mg | ORAL_TABLET | Freq: Three times a day (TID) | ORAL | Status: DC

## 2021-08-28 MED ORDER — TRAMADOL HCL 50 MG PO TABS: 50.0000 mg | ORAL_TABLET | Freq: Four times a day (QID) | ORAL | Status: DC | PRN

## 2021-08-28 MED ORDER — HYDROXYZINE HCL 25 MG PO TABS: 25.0000 mg | ORAL_TABLET | ORAL | 0 refills | Status: DC | PRN

## 2021-08-28 MED ORDER — PANTOPRAZOLE SODIUM 40 MG PO TBEC: 40.0000 mg | DELAYED_RELEASE_TABLET | Freq: Every day | ORAL | Status: DC

## 2021-08-28 MED ORDER — PHENOL 1.4 % MT LIQD
1.0000 | OROMUCOSAL | 0 refills | Status: DC | PRN
Start: 2021-08-28 — End: 2022-04-24

## 2021-08-28 MED ORDER — BETHANECHOL CHLORIDE 5 MG PO TABS: 5.0000 mg | ORAL_TABLET | Freq: Two times a day (BID) | ORAL | Status: DC

## 2021-08-28 NOTE — TOC Transition Note (Signed)
Transition of Care [place]) - CM/SW Discharge Note   Patient Details  Name: Malyah Ohlrich MRN: [id number] Date of Birth: 02-27-1985  Transition of Care [facility]) CM/SW Contact:  Glennon Mac, RN Phone Number: 08/28/2021, 9:55 AM   Clinical Narrative:    Patient medically stable for discharge to [facility] inpatient rehab today.  Updated clinical information faxed to [facility] in admissions at [phone].  Bedside nurse to call report to [facility] inpatient rehab at [phone].  PTAR notified for transport, with estimated pickup time around 11am.     Final next level of care: IP Rehab Facility Barriers to Discharge: Barriers Resolved   Patient Goals and CMS Choice Patient states their goals for this hospitalization and ongoing recovery are:: to go home CMS Medicare.gov Compare Post Acute Care list provided to:: Patient Choice offered to / list presented to : Patient                        Discharge Plan and Services   Discharge Planning Services: CM Consult Post Acute Care Choice: IP Rehab                               Social Determinants of Health (SDOH) Interventions     Readmission Risk Interventions     No data to display         Quintella Baton, RN, BSN  Trauma/Neuro ICU Case Manager [phone]
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
## 2022-04-16 ENCOUNTER — Ambulatory Visit: Payer: Self-pay | Admitting: Student

## 2022-04-18 NOTE — Pre-Procedure Instructions (Signed)
Surgical Instructions    Your procedure is scheduled on Wednesday, April 24, 2022 at 10:04 AM.  Report to [facility] Main Entrance "A" at 8:00 A.M., then check in with the Admitting office.  Call this number if you have problems the morning of surgery:  [phone]   If you have any questions prior to your surgery date call [phone]: Open Monday-Friday 8am-4pm  *If you experience any cold or flu symptoms such as cough, fever, chills, shortness of breath, etc. between now and your scheduled surgery, please notify us.*    Remember:  Do not eat or drink after midnight the night before your surgery    Take these medicines the morning of surgery with A SIP OF WATER:  INCASSIA  VRAYLAR   IF NEEDED: acetaminophen (TYLENOL)  hydrOXYzine (ATARAX)  oxyCODONE (OXY IR/ROXICODONE)  venlafaxine XR (EFFEXOR-XR)   As of today, STOP taking any Aspirin (unless otherwise instructed by your surgeon) Aleve, Naproxen, Ibuprofen, Motrin, Advil, Goody's, BC's, all herbal medications, fish oil, and all vitamins.                     Do NOT Smoke (Tobacco/Vaping) for 24 hours prior to your procedure.  If you use a CPAP at night, you may bring your mask/headgear for your overnight stay.   Contacts, glasses, piercing's, hearing aid's, dentures or partials may not be worn into surgery, please bring cases for these belongings.    For patients admitted to the hospital, discharge time will be determined by your treatment team.   Patients discharged the day of surgery will not be allowed to drive home, and someone needs to stay with them for 24 hours.  SURGICAL WAITING ROOM VISITATION Patients having surgery or a procedure may have 2 support people in the waiting area. Visitors may stay in the waiting area during the procedure and switch out with other visitors if needed. Only 1 support person is allowed in the pre-op area with the patient AFTER the patient is prepped. This person cannot be switched  out.  Children under the age of 67 must have an adult accompany them who is not the patient. If the patient needs to stay at the hospital during part of their recovery, the visitor guidelines for inpatient rooms apply.  Please refer to the [facility] website for the visitor guidelines for Inpatients (after your surgery is over and you are in a regular room).    Special instructions:   [place]- Preparing For Surgery  Before surgery, you can play an important role. Because skin is not sterile, your skin needs to be as free of germs as possible. You can reduce the number of germs on your skin by washing with CHG (chlorahexidine gluconate) Soap before surgery.  CHG is an antiseptic cleaner which kills germs and bonds with the skin to continue killing germs even after washing.    Oral Hygiene is also important to reduce your risk of infection.  Remember - BRUSH YOUR TEETH THE MORNING OF SURGERY WITH YOUR REGULAR TOOTHPASTE  Please do not use if you have an allergy to CHG or antibacterial soaps. If your skin becomes reddened/irritated stop using the CHG.  Do not shave (including legs and underarms) for at least 48 hours prior to first CHG shower. It is OK to shave your face.  Please follow these instructions carefully.   Shower the NIGHT BEFORE SURGERY and the MORNING OF SURGERY  If you chose to wash your hair, wash your hair  first as usual with your normal shampoo.  After you shampoo, rinse your hair and body thoroughly to remove the shampoo.  Use CHG Soap as you would any other liquid soap. You can apply CHG directly to the skin and wash gently with a scrungie or a clean washcloth.   Apply the CHG Soap to your body ONLY FROM THE NECK DOWN (neck, arms, chest, abdomen, legs, and back).  Do not use on open wounds or open sores. Avoid contact with your eyes, ears, mouth and genitals (private parts). Wash Face and genitals (private parts)  with your normal soap.   Wash thoroughly, paying  special attention to the area where your surgery will be performed.  Thoroughly rinse your body with warm water from the neck down.  DO NOT shower/wash with your normal soap after using and rinsing off the CHG Soap.  Pat yourself dry with a CLEAN TOWEL.  Wear CLEAN PAJAMAS to bed the night before surgery  Place CLEAN SHEETS on your bed the night before your surgery  DO NOT SLEEP WITH PETS.   Day of Surgery: Take a shower with CHG soap. Do not wear lotions, powders, perfumes/colognes, or deodorant. Do not wear jewelry or makeup Do not shave 48 hours prior to surgery. Do not wear nail polish, gel polish, artificial nails, or any other type of covering on natural nails (fingers and toes) If you have artificial nails or gel coating that need to be removed by a nail salon, please have this removed prior to surgery. Artificial nails or gel coating may interfere with anesthesia's ability to adequately monitor your vital signs. Wear Clean/Comfortable clothing the morning of surgery Do not bring valuables to the hospital.  [facility] is not responsible for any belongings or valuables. Remember to brush your teeth WITH YOUR REGULAR TOOTHPASTE.   Please read over the following fact sheets that you were given.  If you received a COVID test during your pre-op visit  it is requested that you wear a mask when out in public, stay away from anyone that may not be feeling well and notify your surgeon if you develop symptoms. If you have been in contact with anyone that has tested positive in the last 10 days please notify you surgeon.

## 2022-04-19 ENCOUNTER — Encounter (HOSPITAL_COMMUNITY)
Admission: RE | Admit: 2022-04-19 | Discharge: 2022-04-19 | Disposition: A | Discharge status: Home / Self Care | Payer: Medicaid Other | Source: Ambulatory Visit | Attending: Student | Admitting: Student

## 2022-04-19 ENCOUNTER — Encounter (HOSPITAL_COMMUNITY): Payer: Self-pay

## 2022-04-19 ENCOUNTER — Other Ambulatory Visit: Payer: Self-pay

## 2022-04-19 VITALS — BP 136/81 | HR 79 | Temp 98.2°F | Resp 18 | Ht 67.0 in | Wt 180.8 lb

## 2022-04-19 DIAGNOSIS — Z01812 Encounter for preprocedural laboratory examination: Secondary | ICD-10-CM | POA: Diagnosis present

## 2022-04-19 DIAGNOSIS — Z01818 Encounter for other preprocedural examination: Secondary | ICD-10-CM

## 2022-04-19 LAB — COMPREHENSIVE METABOLIC PANEL
ALT: 14 U/L (ref 0–44)
AST: 18 U/L (ref 15–41)
Albumin: 4 g/dL (ref 3.5–5.0)
Alkaline Phosphatase: 108 U/L (ref 38–126)
Anion gap: 7 (ref 5–15)
BUN: 14 mg/dL (ref 6–20)
CO2: 26 mmol/L (ref 22–32)
Calcium: 8.8 mg/dL — ABNORMAL LOW (ref 8.9–10.3)
Chloride: 105 mmol/L (ref 98–111)
Creatinine, Ser: 0.54 mg/dL (ref 0.44–1.00)
GFR, Estimated: >60 mL/min (ref >60)
Glucose, Bld: 74 mg/dL (ref 70–99)
Potassium: 3.5 mmol/L (ref 3.5–5.1)
Sodium: 138 mmol/L (ref 135–145)
Total Bilirubin: 0.4 mg/dL (ref 0.3–1.2)
Total Protein: 6.8 g/dL (ref 6.5–8.1)

## 2022-04-19 LAB — CBC
HCT: 38.5 % (ref 36.0–46.0)
Hemoglobin: 13.5 g/dL (ref 12.0–15.0)
MCH: 30.5 pg (ref 26.0–34.0)
MCHC: 35.1 g/dL (ref 30.0–36.0)
MCV: 87.1 fL (ref 80.0–100.0)
Platelets: 205 10*3/uL (ref 150–400)
RBC: 4.42 MIL/uL (ref 3.87–5.11)
RDW: 12.3 % (ref 11.5–15.5)
WBC: 7.5 10*3/uL (ref 4.0–10.5)
nRBC: 0 % (ref 0.0–0.2)

## 2022-04-19 LAB — SURGICAL PCR SCREEN
MRSA, PCR: NEGATIVE
Staphylococcus aureus: NEGATIVE

## 2022-04-19 NOTE — Progress Notes (Signed)
PCP - Beola Cord with [phone]  Cardiologist - Denies  PPM/ICD - Denies  Chest x-ray - N/I EKG - N/I Stress Test - Denies ECHO - Denies Cardiac Cath - Denies  Sleep Study - Denies  DM - Denies  Blood Thinner Instructions:n/i Aspirin Instructions:n/i  COVID TEST- N/A   Anesthesia review: No  Patient denies shortness of breath, fever, cough and chest pain at PAT appointment  Patient denies any respiratory illness, covid, PNA, flu or cold in the past two months.   All instructions explained to the patient, with a verbal understanding of the material. Patient agrees to go over the instructions while at home for a better understanding. The opportunity to ask questions was provided.
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
## 2022-04-23 NOTE — Anesthesia Preprocedure Evaluation (Signed)
Anesthesia Evaluation  Patient identified by MRN, date of birth, ID band Patient awake    Reviewed: Allergy & Precautions, NPO status , Patient's Chart, lab work & pertinent test results  Airway Mallampati: II  TM Distance: >3 FB Neck ROM: Full    Dental  (+) Dental Advisory Given, Teeth Intact, Missing   Pulmonary Patient abstained from smoking., former smoker   Pulmonary exam normal breath sounds clear to auscultation       Cardiovascular hypertension, Normal cardiovascular exam Rhythm:Regular Rate:Normal     Neuro/Psych  PSYCHIATRIC DISORDERS Anxiety Depression    negative neurological ROS     GI/Hepatic Neg liver ROS,GERD  Medicated,,  Endo/Other  negative endocrine ROS    Renal/GU Renal disease     Musculoskeletal Right calcaneal fx   Abdominal   Peds  Hematology  (+) Blood dyscrasia, anemia   Anesthesia Other Findings   Reproductive/Obstetrics                             Anesthesia Physical Anesthesia Plan  ASA: 3  Anesthesia Plan: General   Post-op Pain Management: Tylenol PO (pre-op)* and Regional block*   Induction: Intravenous  PONV Risk Score and Plan: 2 and Treatment may vary due to age or medical condition, Ondansetron, Dexamethasone and Midazolam  Airway Management Planned: LMA and Oral ETT  Additional Equipment: None  Intra-op Plan:   Post-operative Plan: Extubation in OR  Informed Consent: I have reviewed the patients History and Physical, chart, labs and discussed the procedure including the risks, benefits and alternatives for the proposed anesthesia with the patient or authorized representative who has indicated his/her understanding and acceptance.     Dental advisory given  Plan Discussed with: CRNA  Anesthesia Plan Comments:         Anesthesia Quick Evaluation

## 2022-04-24 ENCOUNTER — Encounter (HOSPITAL_COMMUNITY)
Admission: RE | Disposition: A | Discharge status: Home / Self Care | Payer: Self-pay | Source: Home / Self Care | Attending: Student

## 2022-04-24 ENCOUNTER — Ambulatory Visit (HOSPITAL_COMMUNITY): Payer: Medicaid Other

## 2022-04-24 ENCOUNTER — Other Ambulatory Visit: Payer: Self-pay

## 2022-04-24 ENCOUNTER — Ambulatory Visit (HOSPITAL_BASED_OUTPATIENT_CLINIC_OR_DEPARTMENT_OTHER): Payer: Medicaid Other | Admitting: Anesthesiology

## 2022-04-24 ENCOUNTER — Encounter (HOSPITAL_COMMUNITY): Payer: Self-pay | Admitting: Student

## 2022-04-24 ENCOUNTER — Ambulatory Visit (HOSPITAL_COMMUNITY)
Admission: RE | Admit: 2022-04-24 | Discharge: 2022-04-24 | Disposition: A | Discharge status: Home / Self Care | Payer: Medicaid Other | Attending: Student | Admitting: Student

## 2022-04-24 ENCOUNTER — Ambulatory Visit (HOSPITAL_COMMUNITY): Payer: Medicaid Other | Admitting: Anesthesiology

## 2022-04-24 DIAGNOSIS — K219 Gastro-esophageal reflux disease without esophagitis: Secondary | ICD-10-CM | POA: Diagnosis not present

## 2022-04-24 DIAGNOSIS — S92111A Displaced fracture of neck of right talus, initial encounter for closed fracture: Secondary | ICD-10-CM

## 2022-04-24 DIAGNOSIS — Z87891 Personal history of nicotine dependence: Secondary | ICD-10-CM | POA: Diagnosis not present

## 2022-04-24 DIAGNOSIS — S92001K Unspecified fracture of right calcaneus, subsequent encounter for fracture with nonunion: Secondary | ICD-10-CM | POA: Insufficient documentation

## 2022-04-24 DIAGNOSIS — M19071 Primary osteoarthritis, right ankle and foot: Secondary | ICD-10-CM | POA: Diagnosis not present

## 2022-04-24 DIAGNOSIS — F1290 Cannabis use, unspecified, uncomplicated: Secondary | ICD-10-CM | POA: Insufficient documentation

## 2022-04-24 DIAGNOSIS — I10 Essential (primary) hypertension: Secondary | ICD-10-CM | POA: Diagnosis not present

## 2022-04-24 DIAGNOSIS — Z01818 Encounter for other preprocedural examination: Secondary | ICD-10-CM

## 2022-04-24 DIAGNOSIS — S92001P Unspecified fracture of right calcaneus, subsequent encounter for fracture with malunion: Secondary | ICD-10-CM

## 2022-04-24 HISTORY — PX: ORIF CALCANEOUS FRACTURE: SHX5030

## 2022-04-24 LAB — POCT PREGNANCY, URINE: Preg Test, Ur: NEGATIVE

## 2022-04-24 SURGERY — OPEN REDUCTION INTERNAL FIXATION (ORIF) CALCANEOUS FRACTURE
Anesthesia: General | Laterality: Right

## 2022-04-24 MED ORDER — DEXAMETHASONE SODIUM PHOSPHATE 10 MG/ML IJ SOLN
INTRAMUSCULAR | Status: AC
  Filled 2022-04-24: qty 1

## 2022-04-24 MED ORDER — LIDOCAINE 2% (20 MG/ML) 5 ML SYRINGE
INTRAMUSCULAR | Status: DC | PRN
  Administered 2022-04-24: 80 mg via INTRAVENOUS

## 2022-04-24 MED ORDER — PROPOFOL 10 MG/ML IV BOLUS
INTRAVENOUS | Status: AC
  Filled 2022-04-24: qty 40

## 2022-04-24 MED ORDER — OXYCODONE HCL 10 MG PO TABS: 5.0000 mg | ORAL_TABLET | ORAL | 0 refills | Status: DC | PRN

## 2022-04-24 MED ORDER — CEFAZOLIN SODIUM-DEXTROSE 2-4 GM/100ML-% IV SOLN
2.0000 g | INTRAVENOUS | Status: AC
  Administered 2022-04-24: 2 g via INTRAVENOUS
  Filled 2022-04-24: qty 100

## 2022-04-24 MED ORDER — VANCOMYCIN HCL 1000 MG IV SOLR
INTRAVENOUS | Status: DC | PRN
  Administered 2022-04-24: 1000 mg

## 2022-04-24 MED ORDER — ACETAMINOPHEN 500 MG PO TABS
1000.0000 mg | ORAL_TABLET | Freq: Once | ORAL | Status: AC
  Administered 2022-04-24: 1000 mg via ORAL
  Filled 2022-04-24: qty 2

## 2022-04-24 MED ORDER — FENTANYL CITRATE (PF) 100 MCG/2ML IJ SOLN
INTRAMUSCULAR | Status: AC
  Filled 2022-04-24: qty 2

## 2022-04-24 MED ORDER — DEXAMETHASONE SODIUM PHOSPHATE 10 MG/ML IJ SOLN
INTRAMUSCULAR | Status: DC | PRN
  Administered 2022-04-24: 10 mg via INTRAVENOUS

## 2022-04-24 MED ORDER — AMISULPRIDE (ANTIEMETIC) 5 MG/2ML IV SOLN: 10.0000 mg | Freq: Once | INTRAVENOUS | Status: DC | PRN

## 2022-04-24 MED ORDER — VANCOMYCIN HCL 1000 MG IV SOLR
INTRAVENOUS | Status: AC
  Filled 2022-04-24: qty 20

## 2022-04-24 MED ORDER — PHENYLEPHRINE 80 MCG/ML (10ML) SYRINGE FOR IV PUSH (FOR BLOOD PRESSURE SUPPORT)
PREFILLED_SYRINGE | INTRAVENOUS | Status: AC
  Filled 2022-04-24: qty 10

## 2022-04-24 MED ORDER — PROPOFOL 10 MG/ML IV BOLUS
INTRAVENOUS | Status: DC | PRN
  Administered 2022-04-24: 200 mg via INTRAVENOUS

## 2022-04-24 MED ORDER — ROPIVACAINE HCL 7.5 MG/ML IJ SOLN
INTRAMUSCULAR | Status: DC | PRN
  Administered 2022-04-24: 20 mL via PERINEURAL

## 2022-04-24 MED ORDER — DEXAMETHASONE SODIUM PHOSPHATE 4 MG/ML IJ SOLN
INTRAMUSCULAR | Status: DC | PRN
  Administered 2022-04-24: 5 mg via PERINEURAL

## 2022-04-24 MED ORDER — LIDOCAINE 2% (20 MG/ML) 5 ML SYRINGE
INTRAMUSCULAR | Status: AC
  Filled 2022-04-24: qty 5

## 2022-04-24 MED ORDER — ONDANSETRON HCL 4 MG/2ML IJ SOLN
INTRAMUSCULAR | Status: DC | PRN
  Administered 2022-04-24: 4 mg via INTRAVENOUS

## 2022-04-24 MED ORDER — CLONIDINE HCL (ANALGESIA) 100 MCG/ML EP SOLN
EPIDURAL | Status: DC | PRN
  Administered 2022-04-24: 100 ug

## 2022-04-24 MED ORDER — FENTANYL CITRATE (PF) 250 MCG/5ML IJ SOLN
INTRAMUSCULAR | Status: DC | PRN
  Administered 2022-04-24 (×2): 50 ug via INTRAVENOUS

## 2022-04-24 MED ORDER — METHOCARBAMOL 500 MG PO TABS: 500.0000 mg | ORAL_TABLET | Freq: Four times a day (QID) | ORAL | 0 refills | Status: DC | PRN

## 2022-04-24 MED ORDER — ONDANSETRON HCL 4 MG/2ML IJ SOLN
INTRAMUSCULAR | Status: AC
  Filled 2022-04-24: qty 2

## 2022-04-24 MED ORDER — POLYETHYLENE GLYCOL 3350 17 G PO PACK: 17.0000 g | PACK | Freq: Every day | ORAL | 0 refills | Status: AC | PRN

## 2022-04-24 MED ORDER — ORAL CARE MOUTH RINSE: 15.0000 mL | Freq: Once | OROMUCOSAL | Status: AC

## 2022-04-24 MED ORDER — FENTANYL CITRATE (PF) 100 MCG/2ML IJ SOLN
25.0000 ug | INTRAMUSCULAR | Status: DC | PRN
  Administered 2022-04-24: 25 ug via INTRAVENOUS

## 2022-04-24 MED ORDER — 0.9 % SODIUM CHLORIDE (POUR BTL) OPTIME
TOPICAL | Status: DC | PRN
  Administered 2022-04-24: 1000 mL

## 2022-04-24 MED ORDER — MIDAZOLAM HCL 2 MG/2ML IJ SOLN
INTRAMUSCULAR | Status: DC | PRN
  Administered 2022-04-24: 2 mg via INTRAVENOUS

## 2022-04-24 MED ORDER — LACTATED RINGERS IV SOLN: INTRAVENOUS | Status: DC

## 2022-04-24 MED ORDER — PROMETHAZINE HCL 25 MG/ML IJ SOLN: 6.2500 mg | INTRAMUSCULAR | Status: DC | PRN

## 2022-04-24 MED ORDER — ASPIRIN 81 MG PO TBEC: 81.0000 mg | DELAYED_RELEASE_TABLET | Freq: Every day | ORAL | 0 refills | Status: DC

## 2022-04-24 MED ORDER — MIDAZOLAM HCL 2 MG/2ML IJ SOLN
INTRAMUSCULAR | Status: AC
  Filled 2022-04-24: qty 2

## 2022-04-24 MED ORDER — PROPOFOL 1000 MG/100ML IV EMUL
INTRAVENOUS | Status: AC
  Filled 2022-04-24: qty 100

## 2022-04-24 MED ORDER — CHLORHEXIDINE GLUCONATE 0.12 % MT SOLN
15.0000 mL | Freq: Once | OROMUCOSAL | Status: AC
  Administered 2022-04-24: 15 mL via OROMUCOSAL
  Filled 2022-04-24: qty 15

## 2022-04-24 MED ORDER — EPHEDRINE 5 MG/ML INJ
INTRAVENOUS | Status: AC
  Filled 2022-04-24: qty 5

## 2022-04-24 MED ORDER — MEPERIDINE HCL 25 MG/ML IJ SOLN: 6.2500 mg | INTRAMUSCULAR | Status: DC | PRN

## 2022-04-24 SURGICAL SUPPLY — 66 items
APL PRP STRL LF DISP 70% ISPRP (MISCELLANEOUS) ×2
BAG COUNTER SPONGE SURGICOUNT (BAG) ×1 IMPLANT
BAG SPNG CNTER NS LX DISP (BAG) ×1
BLADE SURG 10 STRL SS (BLADE) ×1 IMPLANT
BNDG CMPR 5X62 HK CLSR LF (GAUZE/BANDAGES/DRESSINGS) ×1
BNDG ELASTIC 4X5.8 VLCR STR LF (GAUZE/BANDAGES/DRESSINGS) IMPLANT
BNDG ELASTIC 6INX 5YD STR LF (GAUZE/BANDAGES/DRESSINGS) IMPLANT
BONE CANC CHIPS 20CC PCAN1/4 (Bone Implant) ×1 IMPLANT
BRUSH SCRUB EZ PLAIN DRY (MISCELLANEOUS) ×2 IMPLANT
CHIPS CANC BONE 20CC PCAN1/4 (Bone Implant) ×1 IMPLANT
CHLORAPREP W/TINT 26 (MISCELLANEOUS) ×1 IMPLANT
CONNECTOR 5 IN 1 STRAIGHT STRL (MISCELLANEOUS) ×1 IMPLANT
COVER MAYO STAND STRL (DRAPES) ×1 IMPLANT
COVER SURGICAL LIGHT HANDLE (MISCELLANEOUS) ×2 IMPLANT
DRAPE C-ARM 42X72 X-RAY (DRAPES) ×1 IMPLANT
DRAPE C-ARMOR (DRAPES) ×1 IMPLANT
DRAPE INCISE IOBAN 66X45 STRL (DRAPES) ×1 IMPLANT
DRAPE ORTHO SPLIT 77X108 STRL (DRAPES)
DRAPE SURG ORHT 6 SPLT 77X108 (DRAPES) ×2 IMPLANT
DRAPE U-SHAPE 47X51 STRL (DRAPES) ×1 IMPLANT
DRSG EMULSION OIL 3X3 NADH (GAUZE/BANDAGES/DRESSINGS) ×1 IMPLANT
DRSG MEPITEL 4X7.2 (GAUZE/BANDAGES/DRESSINGS) IMPLANT
ELECT CAUTERY BLADE 6.4 (BLADE) IMPLANT
ELECT REM PT RETURN 9FT ADLT (ELECTROSURGICAL) ×1
ELECTRODE REM PT RTRN 9FT ADLT (ELECTROSURGICAL) ×1 IMPLANT
GAUZE SPONGE 4X4 12PLY STRL (GAUZE/BANDAGES/DRESSINGS) ×1 IMPLANT
GLOVE BIO SURGEON STRL SZ 6.5 (GLOVE) ×3 IMPLANT
GLOVE BIO SURGEON STRL SZ7.5 (GLOVE) ×4 IMPLANT
GLOVE BIOGEL PI IND STRL 6.5 (GLOVE) ×1 IMPLANT
GLOVE BIOGEL PI IND STRL 7.5 (GLOVE) ×1 IMPLANT
GOWN STRL REUS W/ TWL LRG LVL3 (GOWN DISPOSABLE) ×2 IMPLANT
GOWN STRL REUS W/TWL LRG LVL3 (GOWN DISPOSABLE) ×2
GRAFT BNE CANC CHIPS 1-8 20CC (Bone Implant) IMPLANT
GUIDEWIRE 1.6 6IN (WIRE) IMPLANT
GUIDEWIRE TYB 2X9 (WIRE) IMPLANT
KIT BASIN OR (CUSTOM PROCEDURE TRAY) ×1 IMPLANT
KIT TURNOVER KIT B (KITS) ×1 IMPLANT
MANIFOLD NEPTUNE II (INSTRUMENTS) ×1 IMPLANT
NDL HYPO 21X1.5 SAFETY (NEEDLE) IMPLANT
NEEDLE HYPO 21X1.5 SAFETY (NEEDLE) IMPLANT
NS IRRIG 1000ML POUR BTL (IV SOLUTION) ×1 IMPLANT
PACK ORTHO EXTREMITY (CUSTOM PROCEDURE TRAY) ×1 IMPLANT
PAD ARMBOARD 7.5X6 YLW CONV (MISCELLANEOUS) ×2 IMPLANT
PAD CAST 4YDX4 CTTN HI CHSV (CAST SUPPLIES) ×2 IMPLANT
PADDING CAST COTTON 4X4 STRL (CAST SUPPLIES) ×1
PADDING CAST COTTON 6X4 STRL (CAST SUPPLIES) ×1 IMPLANT
PIN GUIDE DRIL TIP 2.8X300 STE (PIN) IMPLANT
PIN HALF YELLOW 5X160X35 (EXFIX) IMPLANT
SCREW CANNULATED 6.5X55 KNEE (Screw) IMPLANT
SCREW CANNULATED 6.5X60 KNEE (Screw) IMPLANT
SCREW SHANZ 4.0X60MM (EXFIX) IMPLANT
SPLINT PLASTER CAST FAST 5X30 (CAST SUPPLIES) IMPLANT
SPONGE T-LAP 18X18 ~~LOC~~+RFID (SPONGE) IMPLANT
SUCTION FRAZIER HANDLE 10FR (MISCELLANEOUS) ×1
SUCTION TUBE FRAZIER 10FR DISP (MISCELLANEOUS) ×1 IMPLANT
SUT ETHILON 3 0 PS 1 (SUTURE) ×2 IMPLANT
SUT VIC AB 0 CT1 27 (SUTURE) ×2
SUT VIC AB 0 CT1 27XBRD ANBCTR (SUTURE) ×1 IMPLANT
SUT VIC AB 2-0 CT1 27 (SUTURE)
SUT VIC AB 2-0 CT1 TAPERPNT 27 (SUTURE) IMPLANT
SYR CONTROL 10ML LL (SYRINGE) IMPLANT
TOWEL GREEN STERILE (TOWEL DISPOSABLE) ×2 IMPLANT
TOWEL GREEN STERILE FF (TOWEL DISPOSABLE) ×1 IMPLANT
TUBE CONNECTING 12X1/4 (SUCTIONS) ×2 IMPLANT
UNDERPAD 30X36 HEAVY ABSORB (UNDERPADS AND DIAPERS) ×1 IMPLANT
WATER STERILE IRR 1000ML POUR (IV SOLUTION) ×1 IMPLANT

## 2022-04-24 NOTE — Anesthesia Postprocedure Evaluation (Signed)
Anesthesia Post Note  Patient: Shelley [name]  Procedure(s) Performed: SUBTALAR FUSION WITH REVISION FIXATION (Right)     Patient location during evaluation: PACU Anesthesia Type: General Level of consciousness: sedated and patient cooperative Pain management: pain level controlled Vital Signs Assessment: post-procedure vital signs reviewed and stable Respiratory status: spontaneous breathing Cardiovascular status: stable Anesthetic complications: no   No notable events documented.  Last Vitals:  Vitals:   04/24/22 1215 04/24/22 1228  BP: 136/82 121/80  Pulse: 85 88  Resp:  17  Temp: 36.6 C   SpO2: 98% 97%    Last Pain:  Vitals:   04/24/22 1215  TempSrc:   PainSc: [place]

## 2022-04-24 NOTE — Transfer of Care (Signed)
Immediate Anesthesia Transfer of Care Note  Patient: Shelley [name]  Procedure(s) Performed: Erin Fulling WITH REVISION FIXATION (Right)  Patient Location: PACU  Anesthesia Type:General and Regional  Level of Consciousness: drowsy and patient cooperative  Airway & Oxygen Therapy: Patient Spontanous Breathing  Post-op Assessment: Report given to RN, Post -op Vital signs reviewed and stable, and Patient moving all extremities X 4  Post vital signs: Reviewed and stable  Last Vitals:  Vitals Value Taken Time  BP 138/97 04/24/22 1145  Temp    Pulse 94 04/24/22 1147  Resp 14 04/24/22 1147  SpO2 97 % 04/24/22 1147  Vitals shown include unvalidated device data.  Last Pain:  Vitals:   04/24/22 0819  TempSrc:   PainSc: 0-No pain         Complications: No notable events documented.

## 2022-04-24 NOTE — Discharge Instructions (Addendum)
Katha Hamming, MD Patrecia Pace PA-C [facility] [place] [phone] (tel)   [phone] (fax)                                  POST-OPERATIVE INSTRUCTIONS     WEIGHT BEARING STATUS: non-weightbearing right lower extremity  RANGE OF MOTION/ACTIVITY: Ok for knee range of motion  WOUND CARE Please keep splint clean dry and intact until follow-up. If your splint gets wet for any reason please contact the office immediately.  Do not stick anything down your splint such as pencils, momey, hangers to try and scratch yourself.  If you feel itchy take Benadryl as prescribed on the bottle for itching You may shower on Post-Op Day #2.  You must keep splint dry during this process and may find that a plastic bag taped around the extremity or alternatively a towel based bath may be a better option.   If you get your splint wet or if it is damaged please contact our clinic.  EXERCISES Due to your splint being in place you will not be able to bear weight through your extremity.   DO NOT PUT ANY WEIGHT ON YOUR OPERATIVE LEG Please use crutches or a walker to avoid weight bearing.   DVT/PE prophylaxis: None  DIET: As you were eating previously.  Can use over the counter stool softeners and bowel preparations, such as Miralax, to help with bowel movements.  Narcotics can be constipating.  Be sure to drink plenty of fluids  REGIONAL ANESTHESIA (NERVE BLOCKS) The anesthesia team may have performed a nerve block for you if safe in the setting of your care.  This is a great tool used to minimize pain.  Typically the block may start wearing off overnight but the long acting medicine may last for 3-4 days.  The nerve block wearing off can be a challenging period but please utilize your as needed pain medications to try and manage this period.    POST-OP MEDICATIONS- Multimodal approach to pain control  In general your pain will be controlled with a combination of  substances.  Prescriptions unless otherwise discussed are electronically sent to your pharmacy.  This is a carefully made plan we use to minimize narcotic use.     - Meloxicam OR Celebrex - Anti-inflammatory medication taken on a scheduled basis  - Acetaminophen - Non-narcotic pain medicine taken on a scheduled basis   - Oxycodone - This is a strong narcotic, to be used only on an "as needed" basis for pain.  -  Aspirin 81mg  - This medicine is used to minimize the risk of blood clots after surgery.  FOLLOW-UP If you develop a Fever (>101.5), Redness or Drainage from the surgical incision site, please call our office to arrange for an evaluation. Please call the office to schedule a follow-up appointment for your incision check if you do not already have one, 7-10 days post-operatively.   VISIT OUR WEBSITE FOR ADDITIONAL INFORMATION: [email or web address]   HELPFUL INFORMATION  If you had a block, it will wear off between 8-24 hrs postop typically.  This is period when your pain may go from nearly zero to the pain you would have had postop without the block.  This is an abrupt transition but nothing dangerous is happening.  You may take an extra dose of narcotic when this happens.  You should wean off your narcotic medicines as soon as  you are able.  Most patients will be off or using minimal narcotics before their first postop appointment.   We suggest you use the pain medication the first night prior to going to bed, in order to ease any pain when the anesthesia wears off. You should avoid taking pain medications on an empty stomach as it will make you nauseous.  Do not drink alcoholic beverages or take illicit drugs when taking pain medications.  In most states it is against the law to drive while you are in a splint or sling.  And certainly against the law to drive while taking narcotics.  You may return to work/school in the next couple of days when you feel up to it.   Pain medication  may make you constipated.  Below are a few solutions to try in this order: Decrease the amount of pain medication if you aren't having pain. Drink lots of decaffeinated fluids. Drink prune juice and/or each dried prunes  If the first 3 don't work start with additional solutions Take Colace - an over-the-counter stool softener Take Senokot - an over-the-counter laxative Take Miralax - a stronger over-the-counter laxative
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
## 2022-04-24 NOTE — Interval H&P Note (Signed)
History and Physical Interval Note:  04/24/2022 8:37 AM  Shelley [name]  has presented today for surgery, with the diagnosis of Right subtalar arthritis.  The various methods of treatment have been discussed with the patient and family. After consideration of risks, benefits and other options for treatment, the patient has consented to  Procedure(s): [place] (Right) as a surgical intervention.  The patient's history has been reviewed, patient examined, no change in status, stable for surgery.  I have reviewed the patient's chart and labs.  Questions were answered to the patient's satisfaction.     Lennette Bihari P Kinslei Labine

## 2022-04-24 NOTE — Anesthesia Procedure Notes (Signed)
Procedure Name: LMA Insertion Date/Time: 04/24/2022 9:18 AM  Performed by: Reggie Pile, CRNAPre-anesthesia Checklist: Emergency Drugs available, Patient identified, Suction available and Patient being monitored Patient Re-evaluated:Patient Re-evaluated prior to induction Oxygen Delivery Method: Circle system utilized Preoxygenation: Pre-oxygenation with 100% oxygen Induction Type: IV induction LMA Size: 4.0

## 2022-04-24 NOTE — Anesthesia Procedure Notes (Signed)
Anesthesia Regional Block: Popliteal block   Pre-Anesthetic Checklist: , timeout performed,  Correct Patient, Correct Site, Correct Laterality,  Correct Procedure, Correct Position, site marked,  Risks and benefits discussed,  Surgical consent,  Pre-op evaluation,  At surgeon's request and post-op pain management  Laterality: Lower and Right  Prep: chloraprep       Needles:  Injection technique: Single-shot  Needle Type: Stimiplex     Needle Length: 10cm  Needle Gauge: 21     Additional Needles:   Procedures:,,,, ultrasound used (permanent image in chart),,   Motor weakness within 5 minutes.  Narrative:  Start time: 04/24/2022 8:32 AM End time: 04/24/2022 8:52 AM Injection made incrementally with aspirations every 5 mL.  Performed by: Personally  Anesthesiologist: Nolon Nations, MD  Additional Notes: Nerve located and needle positioned with direct ultrasound guidance. Good perineural spread. Patient tolerated well.

## 2022-04-26 ENCOUNTER — Encounter (HOSPITAL_COMMUNITY): Payer: Self-pay | Admitting: Student

## 2022-11-29 ENCOUNTER — Other Ambulatory Visit: Payer: Self-pay | Admitting: Student

## 2022-11-29 DIAGNOSIS — M19171 Post-traumatic osteoarthritis, right ankle and foot: Secondary | ICD-10-CM

## 2023-06-19 ENCOUNTER — Ambulatory Visit: Payer: Self-pay | Admitting: Student

## 2023-07-03 NOTE — Progress Notes (Signed)
 Surgical Instructions   Your procedure is scheduled on July 11, 2023. Report to [facility] Main Entrance "A" at 5:30 A.M., then check in with the Admitting office. Any questions or running late day of surgery: call [phone]  Questions prior to your surgery date: call [phone], Monday-Friday, 8am-4pm. If you experience any cold or flu symptoms such as cough, fever, chills, shortness of breath, etc. between now and your scheduled surgery, please notify us  at the above number.     Remember:  Do not eat after midnight the night before your surgery   You may drink clear liquids until 4:30 the morning of your surgery.   Clear liquids allowed are: Water, Non-Citrus Juices (without pulp), Carbonated Beverages, Clear Tea (no milk, honey, etc.), Black Coffee Only (NO MILK, CREAM OR POWDERED CREAMER of any kind), and Gatorade.  Patient Instructions   The night before surgery:  No food after midnight. ONLY clear liquids after midnight   The day of surgery (if you do NOT have diabetes):  Drink ONE (1) Pre-Surgery Clear Ensure by 4:30 the morning of surgery. Drink in one sitting. Do not sip.  This drink was given to you during your hospital  pre-op appointment visit.   Nothing else to drink after completing the  Pre-Surgery Clear Ensure.            If you have questions, please contact your surgeon's office.         Take these medicines the morning of surgery with A SIP OF WATER   famotidine (PEPCID)  pantoprazole  (PROTONIX )  venlafaxine XR (EFFEXOR-XR)  May take these medicines IF NEEDED:  acetaminophen  (TYLENOL )     One week prior to surgery, STOP taking any Aspirin  (unless otherwise instructed by your surgeon) Aleve, Naproxen, Ibuprofen , Motrin , Advil , Goody's, BC's, all herbal medications, fish oil, and non-prescription vitamins.                     Do NOT Smoke (Tobacco/Vaping) for 24 hours prior to your procedure.  If you use a CPAP at night, you may bring your  mask/headgear for your overnight stay.   You will be asked to remove any contacts, glasses, piercing's, hearing aid's, dentures/partials prior to surgery. Please bring cases for these items if needed.    Patients discharged the day of surgery will not be allowed to drive home, and someone needs to stay with them for 24 hours.  SURGICAL WAITING ROOM VISITATION Patients may have no more than 2 support people in the waiting area - these visitors may rotate.   Pre-op nurse will coordinate an appropriate time for 1 ADULT support person, who may not rotate, to accompany patient in pre-op.  Children under the age of 3 must have an adult with them who is not the patient and must remain in the main waiting area with an adult.  If the patient needs to stay at the hospital during part of their recovery, the visitor guidelines for inpatient rooms apply.  Please refer to the [facility] website for the visitor guidelines for any additional information.   If you received a COVID test during your pre-op visit  it is requested that you wear a mask when out in public, stay away from anyone that may not be feeling well and notify your surgeon if you develop symptoms. If you have been in contact with anyone that has tested positive in the last 10 days please notify you surgeon.      Pre-operative  CHG Bathing Instructions   You can play a key role in reducing the risk of infection after surgery. Your skin needs to be as free of germs as possible. You can reduce the number of germs on your skin by washing with CHG (chlorhexidine  gluconate) soap before surgery. CHG is an antiseptic soap that kills germs and continues to kill germs even after washing.   DO NOT use if you have an allergy to chlorhexidine /CHG or antibacterial soaps. If your skin becomes reddened or irritated, stop using the CHG and notify one of our RNs at [phone].              TAKE A SHOWER THE NIGHT BEFORE SURGERY AND THE DAY OF SURGERY     Please keep in mind the following:  DO NOT shave, including legs and underarms, 48 hours prior to surgery.   You may shave your face before/day of surgery.  Place clean sheets on your bed the night before surgery Use a clean washcloth (not used since being washed) for each shower. DO NOT sleep with pet's night before surgery.  CHG Shower Instructions:  Wash your face and private area with normal soap. If you choose to wash your hair, wash first with your normal shampoo.  After you use shampoo/soap, rinse your hair and body thoroughly to remove shampoo/soap residue.  Turn the water OFF and apply half the bottle of CHG soap to a CLEAN washcloth.  Apply CHG soap ONLY FROM YOUR NECK DOWN TO YOUR TOES (washing for 3-5 minutes)  DO NOT use CHG soap on face, private areas, open wounds, or sores.  Pay special attention to the area where your surgery is being performed.  If you are having back surgery, having someone wash your back for you may be helpful. Wait 2 minutes after CHG soap is applied, then you may rinse off the CHG soap.  Pat dry with a clean towel  Put on clean pajamas    Additional instructions for the day of surgery: DO NOT APPLY any lotions, deodorants, cologne, or perfumes.   Do not wear jewelry or makeup Do not wear nail polish, gel polish, artificial nails, or any other type of covering on natural nails (fingers and toes) Do not bring valuables to the hospital. [facility] is not responsible for valuables/personal belongings. Put on clean/comfortable clothes.  Please brush your teeth.  Ask your nurse before applying any prescription medications to the skin.

## 2023-07-03 NOTE — Progress Notes (Signed)
 Surgical Instructions   Your procedure is scheduled on July 11, 2023. Report to [facility] Main Entrance "A" at 5:30 A.M., then check in with the Admitting office. Any questions or running late day of surgery: call [phone]  Questions prior to your surgery date: call [phone], Monday-Friday, 8am-4pm. If you experience any cold or flu symptoms such as cough, fever, chills, shortness of breath, etc. between now and your scheduled surgery, please notify us  at the above number.     Remember:  Do not eat after midnight the night before your surgery  You may drink clear liquids until 4:30 the morning of your surgery.   Clear liquids allowed are: Water, Non-Citrus Juices (without pulp), Carbonated Beverages, Clear Tea (no milk, honey, etc.), Black Coffee Only (NO MILK, CREAM OR POWDERED CREAMER of any kind), and Gatorade. Patient Instructions  The night before surgery:  No food after midnight. ONLY clear liquids after midnight  The day of surgery (if you do NOT have diabetes):  Drink ONE (1) Pre-Surgery Clear Ensure by 4:30 the morning of surgery. Drink in one sitting. Do not sip.  This drink was given to you during your hospital  pre-op appointment visit.  Nothing else to drink after completing the  Pre-Surgery Clear Ensure.          If you have questions, please contact your surgeon's office.      Take these medicines the morning of surgery with A SIP OF WATER  famotidine (PEPCID)  pantoprazole  (PROTONIX )  venlafaxine XR (EFFEXOR-XR)   May take these medicines IF NEEDED: acetaminophen  (TYLENOL )    One week prior to surgery, STOP taking any Aspirin  (unless otherwise instructed by your surgeon) Aleve, Naproxen, Ibuprofen , Motrin , Advil , Goody's, BC's, all herbal medications, fish oil, and non-prescription vitamins.                     Do NOT Smoke (Tobacco/Vaping) for 24 hours prior to your procedure.  If you use a CPAP at night, you may bring your mask/headgear for  your overnight stay.   You will be asked to remove any contacts, glasses, piercing's, hearing aid's, dentures/partials prior to surgery. Please bring cases for these items if needed.    Patients discharged the day of surgery will not be allowed to drive home, and someone needs to stay with them for 24 hours.  SURGICAL WAITING ROOM VISITATION Patients may have no more than 2 support people in the waiting area - these visitors may rotate.   Pre-op nurse will coordinate an appropriate time for 1 ADULT support person, who may not rotate, to accompany patient in pre-op.  Children under the age of 46 must have an adult with them who is not the patient and must remain in the main waiting area with an adult.  If the patient needs to stay at the hospital during part of their recovery, the visitor guidelines for inpatient rooms apply.  Please refer to the [facility] website for the visitor guidelines for any additional information.   If you received a COVID test during your pre-op visit  it is requested that you wear a mask when out in public, stay away from anyone that may not be feeling well and notify your surgeon if you develop symptoms. If you have been in contact with anyone that has tested positive in the last 10 days please notify you surgeon.      Pre-operative 5 CHG Bathing Instructions   You can play a key role  in reducing the risk of infection after surgery. Your skin needs to be as free of germs as possible. You can reduce the number of germs on your skin by washing with CHG (chlorhexidine  gluconate) soap before surgery. CHG is an antiseptic soap that kills germs and continues to kill germs even after washing.   DO NOT use if you have an allergy to chlorhexidine /CHG or antibacterial soaps. If your skin becomes reddened or irritated, stop using the CHG and notify one of our RNs at [phone].   Please shower with the CHG soap starting 4 days before surgery using the following  schedule:     Please keep in mind the following:  DO NOT shave, including legs and underarms, starting the day of your first shower.   You may shave your face at any point before/day of surgery.  Place clean sheets on your bed the day you start using CHG soap. Use a clean washcloth (not used since being washed) for each shower. DO NOT sleep with pets once you start using the CHG.   CHG Shower Instructions:  Wash your face and private area with normal soap. If you choose to wash your hair, wash first with your normal shampoo.  After you use shampoo/soap, rinse your hair and body thoroughly to remove shampoo/soap residue.  Turn the water OFF and apply about 3 tablespoons (45 ml) of CHG soap to a CLEAN washcloth.  Apply CHG soap ONLY FROM YOUR NECK DOWN TO YOUR TOES (washing for 3-5 minutes)  DO NOT use CHG soap on face, private areas, open wounds, or sores.  Pay special attention to the area where your surgery is being performed.  If you are having back surgery, having someone wash your back for you may be helpful. Wait 2 minutes after CHG soap is applied, then you may rinse off the CHG soap.  Pat dry with a clean towel  Put on clean clothes/pajamas   If you choose to wear lotion, please use ONLY the CHG-compatible lotions that are listed below.  Additional instructions for the day of surgery: DO NOT APPLY any lotions, deodorants, cologne, or perfumes.   Do not bring valuables to the hospital. [facility] is not responsible for any belongings/valuables. Do not wear nail polish, gel polish, artificial nails, or any other type of covering on natural nails (fingers and toes) Do not wear jewelry or makeup Put on clean/comfortable clothes.  Please brush your teeth.  Ask your nurse before applying any prescription medications to the skin.     CHG Compatible Lotions   Aveeno Moisturizing lotion  Cetaphil Moisturizing Cream  Cetaphil Moisturizing Lotion  Clairol Herbal Essence  Moisturizing Lotion, Dry Skin  Clairol Herbal Essence Moisturizing Lotion, Extra Dry Skin  Clairol Herbal Essence Moisturizing Lotion, Normal Skin  Curel Age Defying Therapeutic Moisturizing Lotion with Alpha Hydroxy  Curel Extreme Care Body Lotion  Curel Soothing Hands Moisturizing Hand Lotion  Curel Therapeutic Moisturizing Cream, Fragrance-Free  Curel Therapeutic Moisturizing Lotion, Fragrance-Free  Curel Therapeutic Moisturizing Lotion, Original Formula  Eucerin Daily Replenishing Lotion  Eucerin Dry Skin Therapy Plus Alpha Hydroxy Crme  Eucerin Dry Skin Therapy Plus Alpha Hydroxy Lotion  Eucerin Original Crme  Eucerin Original Lotion  Eucerin Plus Crme Eucerin Plus Lotion  Eucerin TriLipid Replenishing Lotion  Keri Anti-Bacterial Hand Lotion  Keri Deep Conditioning Original Lotion Dry Skin Formula Softly Scented  Keri Deep Conditioning Original Lotion, Fragrance Free Sensitive Skin Formula  Keri Lotion Fast Absorbing Fragrance Free Sensitive Skin Formula  Keri Lotion Fast Absorbing Softly Scented Dry Skin Formula  Keri Original Lotion  Keri Skin Renewal Lotion Keri Silky Smooth Lotion  Keri Silky Smooth Sensitive Skin Lotion  Nivea Body Creamy Conditioning Patent examiner Moisturizing Lotion Nivea Crme  Nivea Skin Firming Lotion  NutraDerm 30 Skin Lotion  NutraDerm Skin Lotion  NutraDerm Therapeutic Skin Cream  NutraDerm Therapeutic Skin Lotion  ProShield Protective Hand Cream  Provon moisturizing lotion  Please read over the following fact sheets that you were given.

## 2023-07-04 ENCOUNTER — Encounter (HOSPITAL_COMMUNITY): Payer: Self-pay

## 2023-07-04 ENCOUNTER — Encounter (HOSPITAL_COMMUNITY)
Admission: RE | Admit: 2023-07-04 | Discharge: 2023-07-04 | Disposition: A | Discharge status: Home / Self Care | Source: Ambulatory Visit | Attending: Student | Admitting: Student

## 2023-07-04 ENCOUNTER — Other Ambulatory Visit: Payer: Self-pay

## 2023-07-04 VITALS — BP 126/91 | HR 65 | Temp 98.4°F | Resp 18 | Ht 67.0 in | Wt 172.4 lb

## 2023-07-04 DIAGNOSIS — Z01818 Encounter for other preprocedural examination: Secondary | ICD-10-CM

## 2023-07-04 DIAGNOSIS — Z01812 Encounter for preprocedural laboratory examination: Secondary | ICD-10-CM | POA: Insufficient documentation

## 2023-07-04 HISTORY — DX: Unspecified osteoarthritis, unspecified site: M19.90

## 2023-07-04 HISTORY — DX: Personal history of other diseases of the digestive system: Z87.19

## 2023-07-04 HISTORY — DX: Bipolar disorder, unspecified: F31.9

## 2023-07-04 HISTORY — DX: Gastro-esophageal reflux disease without esophagitis: K21.9

## 2023-07-04 HISTORY — DX: Myoneural disorder, unspecified: G70.9

## 2023-07-04 LAB — CBC
HCT: 44.2 % (ref 36.0–46.0)
Hemoglobin: 15.1 g/dL — ABNORMAL HIGH (ref 12.0–15.0)
MCH: 30.6 pg (ref 26.0–34.0)
MCHC: 34.2 g/dL (ref 30.0–36.0)
MCV: 89.5 fL (ref 80.0–100.0)
Platelets: 216 10*3/uL (ref 150–400)
RBC: 4.94 MIL/uL (ref 3.87–5.11)
RDW: 12.7 % (ref 11.5–15.5)
WBC: 7.8 10*3/uL (ref 4.0–10.5)
nRBC: 0 % (ref 0.0–0.2)

## 2023-07-04 LAB — SURGICAL PCR SCREEN
MRSA, PCR: NEGATIVE
Staphylococcus aureus: NEGATIVE

## 2023-07-04 NOTE — Progress Notes (Signed)
 PCP - Joette Mustard, FNP Cardiologist - Denies  PPM/ICD - denies Device Orders -  Rep Notified -   Chest x-ray - na EKG - 08/11/21 Stress Test - denies ECHO - denies Cardiac Cath -   Sleep Study - denies CPAP -   Fasting Blood Sugar - na Checks Blood Sugar _____ times a day  Last dose of GLP1 agonist-  na GLP1 instructions:   Blood Thinner Instructions:na Aspirin  Instructions:na  ERAS Protcol -clears until 0430 PRE-SURGERY Ensure or G2- Ensure  COVID TEST- na   Anesthesia review: no  Patient denies shortness of breath, fever, cough and chest pain at PAT appointment   All instructions explained to the patient, with a verbal understanding of the material. Patient agrees to go over the instructions while at home for a better understanding.The opportunity to ask questions was provided.
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
## 2023-07-11 ENCOUNTER — Ambulatory Visit (HOSPITAL_COMMUNITY)
Admission: RE | Admit: 2023-07-11 | Discharge: 2023-07-11 | Disposition: A | Discharge status: Home / Self Care | Attending: Student | Admitting: Student

## 2023-07-11 ENCOUNTER — Ambulatory Visit (HOSPITAL_COMMUNITY)

## 2023-07-11 ENCOUNTER — Other Ambulatory Visit: Payer: Self-pay

## 2023-07-11 ENCOUNTER — Ambulatory Visit (HOSPITAL_COMMUNITY): Admitting: Certified Registered Nurse Anesthetist

## 2023-07-11 ENCOUNTER — Encounter (HOSPITAL_COMMUNITY)
Admission: RE | Disposition: A | Discharge status: Home / Self Care | Payer: Self-pay | Source: Home / Self Care | Attending: Student

## 2023-07-11 DIAGNOSIS — M19071 Primary osteoarthritis, right ankle and foot: Secondary | ICD-10-CM | POA: Diagnosis not present

## 2023-07-11 DIAGNOSIS — I10 Essential (primary) hypertension: Secondary | ICD-10-CM | POA: Diagnosis not present

## 2023-07-11 DIAGNOSIS — F319 Bipolar disorder, unspecified: Secondary | ICD-10-CM | POA: Insufficient documentation

## 2023-07-11 DIAGNOSIS — K219 Gastro-esophageal reflux disease without esophagitis: Secondary | ICD-10-CM | POA: Insufficient documentation

## 2023-07-11 DIAGNOSIS — K449 Diaphragmatic hernia without obstruction or gangrene: Secondary | ICD-10-CM | POA: Insufficient documentation

## 2023-07-11 DIAGNOSIS — F419 Anxiety disorder, unspecified: Secondary | ICD-10-CM | POA: Insufficient documentation

## 2023-07-11 DIAGNOSIS — Z87891 Personal history of nicotine dependence: Secondary | ICD-10-CM | POA: Insufficient documentation

## 2023-07-11 DIAGNOSIS — M19171 Post-traumatic osteoarthritis, right ankle and foot: Secondary | ICD-10-CM | POA: Insufficient documentation

## 2023-07-11 HISTORY — PX: ANKLE FUSION: SHX881

## 2023-07-11 LAB — POCT PREGNANCY, URINE: Preg Test, Ur: NEGATIVE

## 2023-07-11 SURGERY — ARTHRODESIS ANKLE
Anesthesia: General | Site: Ankle | Laterality: Right

## 2023-07-11 MED ORDER — DEXAMETHASONE SODIUM PHOSPHATE 10 MG/ML IJ SOLN
INTRAMUSCULAR | Status: DC | PRN
  Administered 2023-07-11: 10 mg via INTRAVENOUS

## 2023-07-11 MED ORDER — ASPIRIN 81 MG PO TBEC: 81.0000 mg | DELAYED_RELEASE_TABLET | Freq: Every day | ORAL | 0 refills | Status: AC

## 2023-07-11 MED ORDER — FENTANYL CITRATE (PF) 100 MCG/2ML IJ SOLN
25.0000 ug | INTRAMUSCULAR | Status: DC | PRN
  Administered 2023-07-11 (×2): 50 ug via INTRAVENOUS

## 2023-07-11 MED ORDER — PROPOFOL 10 MG/ML IV BOLUS
INTRAVENOUS | Status: AC
  Filled 2023-07-11: qty 20

## 2023-07-11 MED ORDER — DEXMEDETOMIDINE HCL IN NACL 80 MCG/20ML IV SOLN
INTRAVENOUS | Status: AC
  Filled 2023-07-11: qty 20

## 2023-07-11 MED ORDER — HYDROMORPHONE HCL 1 MG/ML IJ SOLN
INTRAMUSCULAR | Status: AC
Start: 2023-07-11 — End: ?
  Filled 2023-07-11: qty 0.5

## 2023-07-11 MED ORDER — LACTATED RINGERS IV SOLN: INTRAVENOUS | Status: DC

## 2023-07-11 MED ORDER — ONDANSETRON 4 MG PO TBDP: 4.0000 mg | ORAL_TABLET | Freq: Three times a day (TID) | ORAL | 0 refills | Status: AC | PRN

## 2023-07-11 MED ORDER — LIDOCAINE HCL (CARDIAC) PF 100 MG/5ML IV SOSY
PREFILLED_SYRINGE | INTRAVENOUS | Status: DC | PRN
  Administered 2023-07-11: 50 mg via INTRAVENOUS

## 2023-07-11 MED ORDER — FENTANYL CITRATE (PF) 250 MCG/5ML IJ SOLN
INTRAMUSCULAR | Status: AC
Start: 2023-07-11 — End: ?
  Filled 2023-07-11: qty 5

## 2023-07-11 MED ORDER — DROPERIDOL 2.5 MG/ML IJ SOLN
0.6250 mg | Freq: Once | INTRAMUSCULAR | Status: AC | PRN
  Administered 2023-07-11: 0.625 mg via INTRAVENOUS

## 2023-07-11 MED ORDER — OXYCODONE HCL 5 MG PO TABS
5.0000 mg | ORAL_TABLET | Freq: Once | ORAL | Status: AC | PRN
  Administered 2023-07-11: 5 mg via ORAL

## 2023-07-11 MED ORDER — OXYCODONE HCL 5 MG PO TABS
ORAL_TABLET | ORAL | Status: AC
  Filled 2023-07-11: qty 1

## 2023-07-11 MED ORDER — PHENYLEPHRINE HCL-NACL 20-0.9 MG/250ML-% IV SOLN
INTRAVENOUS | Status: DC | PRN
  Administered 2023-07-11: 40 ug/min via INTRAVENOUS

## 2023-07-11 MED ORDER — MIDAZOLAM HCL 2 MG/2ML IJ SOLN
INTRAMUSCULAR | Status: AC
  Filled 2023-07-11: qty 2

## 2023-07-11 MED ORDER — SCOPOLAMINE 1 MG/3DAYS TD PT72
MEDICATED_PATCH | TRANSDERMAL | Status: AC
Start: 2023-07-11 — End: ?
  Filled 2023-07-11: qty 1

## 2023-07-11 MED ORDER — 0.9 % SODIUM CHLORIDE (POUR BTL) OPTIME
TOPICAL | Status: DC | PRN
  Administered 2023-07-11: 1000 mL

## 2023-07-11 MED ORDER — ACETAMINOPHEN 10 MG/ML IV SOLN
INTRAVENOUS | Status: AC
  Filled 2023-07-11: qty 100

## 2023-07-11 MED ORDER — FENTANYL CITRATE (PF) 250 MCG/5ML IJ SOLN
INTRAMUSCULAR | Status: DC | PRN
  Administered 2023-07-11 (×5): 50 ug via INTRAVENOUS

## 2023-07-11 MED ORDER — ACETAMINOPHEN 10 MG/ML IV SOLN
1000.0000 mg | Freq: Once | INTRAVENOUS | Status: DC | PRN
  Administered 2023-07-11: 1000 mg via INTRAVENOUS

## 2023-07-11 MED ORDER — VANCOMYCIN HCL 1000 MG IV SOLR
INTRAVENOUS | Status: AC
Start: 2023-07-11 — End: ?
  Filled 2023-07-11: qty 20

## 2023-07-11 MED ORDER — ONDANSETRON HCL 4 MG/2ML IJ SOLN
INTRAMUSCULAR | Status: DC | PRN
  Administered 2023-07-11: 4 mg via INTRAVENOUS

## 2023-07-11 MED ORDER — PROPOFOL 10 MG/ML IV BOLUS
INTRAVENOUS | Status: DC | PRN
  Administered 2023-07-11: 200 mg via INTRAVENOUS
  Administered 2023-07-11 (×2): 100 mg via INTRAVENOUS

## 2023-07-11 MED ORDER — OXYCODONE HCL 10 MG PO TABS: 5.0000 mg | ORAL_TABLET | ORAL | 0 refills | Status: AC | PRN

## 2023-07-11 MED ORDER — CHLORHEXIDINE GLUCONATE 0.12 % MT SOLN
15.0000 mL | Freq: Once | OROMUCOSAL | Status: AC
  Administered 2023-07-11: 15 mL via OROMUCOSAL
  Filled 2023-07-11: qty 15

## 2023-07-11 MED ORDER — METHOCARBAMOL 500 MG PO TABS: 500.0000 mg | ORAL_TABLET | Freq: Four times a day (QID) | ORAL | 0 refills | Status: AC | PRN

## 2023-07-11 MED ORDER — ROPIVACAINE HCL 5 MG/ML IJ SOLN
INTRAMUSCULAR | Status: DC | PRN
  Administered 2023-07-11: 15 mL via PERINEURAL

## 2023-07-11 MED ORDER — BUPIVACAINE LIPOSOME 1.3 % IJ SUSP
INTRAMUSCULAR | Status: DC | PRN
  Administered 2023-07-11: 10 mL via PERINEURAL

## 2023-07-11 MED ORDER — FENTANYL CITRATE (PF) 100 MCG/2ML IJ SOLN
INTRAMUSCULAR | Status: AC
  Filled 2023-07-11: qty 2

## 2023-07-11 MED ORDER — MIDAZOLAM HCL 2 MG/2ML IJ SOLN
INTRAMUSCULAR | Status: DC | PRN
  Administered 2023-07-11: 2 mg via INTRAVENOUS

## 2023-07-11 MED ORDER — HYDROMORPHONE HCL 1 MG/ML IJ SOLN
INTRAMUSCULAR | Status: DC | PRN
  Administered 2023-07-11: .5 mg via INTRAVENOUS

## 2023-07-11 MED ORDER — VANCOMYCIN HCL 1000 MG IV SOLR
INTRAVENOUS | Status: DC | PRN
  Administered 2023-07-11: 1000 mg via TOPICAL

## 2023-07-11 MED ORDER — ORAL CARE MOUTH RINSE: 15.0000 mL | Freq: Once | OROMUCOSAL | Status: AC

## 2023-07-11 MED ORDER — DROPERIDOL 2.5 MG/ML IJ SOLN
INTRAMUSCULAR | Status: AC
  Filled 2023-07-11: qty 2

## 2023-07-11 MED ORDER — SCOPOLAMINE 1 MG/3DAYS TD PT72
MEDICATED_PATCH | TRANSDERMAL | Status: DC | PRN
  Administered 2023-07-11: 1 via TRANSDERMAL

## 2023-07-11 MED ORDER — EPHEDRINE SULFATE-NACL 50-0.9 MG/10ML-% IV SOSY
PREFILLED_SYRINGE | INTRAVENOUS | Status: DC | PRN
  Administered 2023-07-11 (×5): 5 mg via INTRAVENOUS

## 2023-07-11 MED ORDER — ONDANSETRON HCL 4 MG/2ML IJ SOLN
INTRAMUSCULAR | Status: AC
  Filled 2023-07-11: qty 2

## 2023-07-11 MED ORDER — CEFAZOLIN SODIUM-DEXTROSE 2-4 GM/100ML-% IV SOLN
2.0000 g | INTRAVENOUS | Status: AC
  Administered 2023-07-11: 2 g via INTRAVENOUS
  Filled 2023-07-11: qty 100

## 2023-07-11 MED ORDER — OXYCODONE HCL 5 MG/5ML PO SOLN: 5.0000 mg | Freq: Once | ORAL | Status: AC | PRN

## 2023-07-11 MED ORDER — DEXAMETHASONE SODIUM PHOSPHATE 10 MG/ML IJ SOLN
INTRAMUSCULAR | Status: AC
Start: 2023-07-11 — End: ?
  Filled 2023-07-11: qty 1

## 2023-07-11 MED ORDER — BUPIVACAINE HCL (PF) 0.5 % IJ SOLN
INTRAMUSCULAR | Status: DC | PRN
Start: 2023-07-11 — End: 2023-07-11
  Administered 2023-07-11: 12 mL via PERINEURAL

## 2023-07-11 SURGICAL SUPPLY — 48 items
BAG COUNTER SPONGE SURGICOUNT (BAG) ×1 IMPLANT
BANDAGE ESMARK 6X9 LF (GAUZE/BANDAGES/DRESSINGS) IMPLANT
BIT DRILL LONG 4.0 (BIT) IMPLANT
BLADE SURG 10 STRL SS (BLADE) ×1 IMPLANT
BNDG ELASTIC 6X10 VLCR STRL LF (GAUZE/BANDAGES/DRESSINGS) IMPLANT
CANISTER SUCTION 3000ML PPV (SUCTIONS) ×1 IMPLANT
CHIPS CANC BONE 20CC PCAN1/4 (Bone Implant) ×1 IMPLANT
CHLORAPREP W/TINT 26 (MISCELLANEOUS) ×1 IMPLANT
COVER SURGICAL LIGHT HANDLE (MISCELLANEOUS) ×1 IMPLANT
CUFF TRNQT CYL 34X4.125X (TOURNIQUET CUFF) ×1 IMPLANT
DRAPE C-ARM 42X72 X-RAY (DRAPES) ×1 IMPLANT
DRAPE C-ARMOR (DRAPES) ×1 IMPLANT
DRAPE U-SHAPE 47X51 STRL (DRAPES) ×1 IMPLANT
ELECTRODE REM PT RTRN 9FT ADLT (ELECTROSURGICAL) ×1 IMPLANT
GAUZE SPONGE 4X4 12PLY STRL (GAUZE/BANDAGES/DRESSINGS) IMPLANT
GLOVE BIO SURGEON STRL SZ 6.5 (GLOVE) ×3 IMPLANT
GLOVE BIO SURGEON STRL SZ7.5 (GLOVE) ×3 IMPLANT
GLOVE BIOGEL PI IND STRL 6.5 (GLOVE) ×1 IMPLANT
GLOVE BIOGEL PI IND STRL 7.5 (GLOVE) ×1 IMPLANT
GOWN STRL REUS W/ TWL LRG LVL3 (GOWN DISPOSABLE) ×3 IMPLANT
GRAFT BNE CANC CHIPS 1-8 20CC (Bone Implant) IMPLANT
GUIDEROD BALL TIP 3.0X800 (ORTHOPEDIC DISPOSABLE SUPPLIES) IMPLANT
KIT BASIN OR (CUSTOM PROCEDURE TRAY) ×1 IMPLANT
KIT TURNOVER KIT B (KITS) ×1 IMPLANT
NAIL IM CANN LOCK 10X160 (Nail) IMPLANT
NS IRRIG 1000ML POUR BTL (IV SOLUTION) ×1 IMPLANT
PACK ORTHO EXTREMITY (CUSTOM PROCEDURE TRAY) ×1 IMPLANT
PAD ABD 8X10 STRL (GAUZE/BANDAGES/DRESSINGS) IMPLANT
PAD ARMBOARD POSITIONER FOAM (MISCELLANEOUS) ×2 IMPLANT
PAD CAST 4YDX4 CTTN HI CHSV (CAST SUPPLIES) ×1 IMPLANT
PADDING CAST ABS COTTON 4X4 ST (CAST SUPPLIES) IMPLANT
PADDING CAST ABS COTTON 6X4 NS (CAST SUPPLIES) IMPLANT
PIN GUIDE 3.2X343MM (PIN) IMPLANT
SCREW TRIGEN LOW PROF 5.0X27.5 (Screw) IMPLANT
SCREW TRIGEN LOW PROF 5.0X40 (Screw) IMPLANT
SCREW TRIGEN LOW PROF 5.0X65 (Screw) IMPLANT
SOAP 2 % CHG 4 OZ (WOUND CARE) ×1 IMPLANT
SPLINT PLASTER CAST XFAST 5X30 (CAST SUPPLIES) IMPLANT
SPONGE T-LAP 18X18 ~~LOC~~+RFID (SPONGE) ×1 IMPLANT
SUCTION TUBE FRAZIER 10FR DISP (SUCTIONS) ×1 IMPLANT
SUT ETHILON 3 0 PS 1 (SUTURE) ×1 IMPLANT
SUT VIC AB 0 CT1 27XBRD ANBCTR (SUTURE) ×1 IMPLANT
SUT VIC AB 2-0 CT1 TAPERPNT 27 (SUTURE) ×2 IMPLANT
SUT VIC AB 3-0 PS2 18XBRD (SUTURE) ×1 IMPLANT
TOWEL GREEN STERILE (TOWEL DISPOSABLE) ×1 IMPLANT
TOWEL GREEN STERILE FF (TOWEL DISPOSABLE) ×1 IMPLANT
TUBE CONNECTING 12X1/4 (SUCTIONS) ×1 IMPLANT
WATER STERILE IRR 1000ML POUR (IV SOLUTION) ×1 IMPLANT

## 2023-07-11 NOTE — Anesthesia Procedure Notes (Signed)
 Procedure Name: LMA Insertion Date/Time: 07/11/2023 7:48 AM  Performed by: Grier Leber, CRNAPre-anesthesia Checklist: Patient identified, Emergency Drugs available, Suction available and Patient being monitored Patient Re-evaluated:Patient Re-evaluated prior to induction Oxygen Delivery Method: Circle System Utilized Preoxygenation: Pre-oxygenation with 100% oxygen Induction Type: IV induction Ventilation: Mask ventilation without difficulty LMA: LMA inserted LMA Size: 4.0 Number of attempts: 1 Placement Confirmation: positive ETCO2 Tube secured with: Tape Dental Injury: Teeth and Oropharynx as per pre-operative assessment

## 2023-07-11 NOTE — Anesthesia Preprocedure Evaluation (Addendum)
 Anesthesia Evaluation  Patient identified by MRN, date of birth, ID band Patient awake    Reviewed: Allergy & Precautions, NPO status , Patient's Chart, lab work & pertinent test results  Airway Mallampati: I  TM Distance: >3 FB Neck ROM: Full    Dental  (+) Missing, Chipped,    Pulmonary former smoker   breath sounds clear to auscultation       Cardiovascular hypertension,  Rhythm:Regular Rate:Normal     Neuro/Psych  PSYCHIATRIC DISORDERS Anxiety Depression Bipolar Disorder    Neuromuscular disease    GI/Hepatic Neg liver ROS, hiatal hernia,GERD  Medicated,,  Endo/Other  negative endocrine ROS    Renal/GU Renal disease     Musculoskeletal  (+) Arthritis ,    Abdominal   Peds  Hematology negative hematology ROS (+)   Anesthesia Other Findings   Reproductive/Obstetrics                             Anesthesia Physical Anesthesia Plan  ASA: 2  Anesthesia Plan: General   Post-op Pain Management: Regional block*   Induction: Intravenous  PONV Risk Score and Plan: 4 or greater and Ondansetron , Dexamethasone , Midazolam  and Scopolamine  patch - Pre-op  Airway Management Planned: LMA  Additional Equipment: None  Intra-op Plan:   Post-operative Plan: Extubation in OR  Informed Consent: I have reviewed the patients History and Physical, chart, labs and discussed the procedure including the risks, benefits and alternatives for the proposed anesthesia with the patient or authorized representative who has indicated his/her understanding and acceptance.     Dental advisory given  Plan Discussed with: CRNA  Anesthesia Plan Comments:        Anesthesia Quick Evaluation
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
## 2023-07-11 NOTE — Anesthesia Procedure Notes (Signed)
 Anesthesia Regional Block: Popliteal block   Pre-Anesthetic Checklist: , timeout performed,  Correct Patient, Correct Site, Correct Laterality,  Correct Procedure, Correct Position, site marked,  Risks and benefits discussed,  Surgical consent,  Pre-op evaluation,  At surgeon's request and post-op pain management  Laterality: Right  Prep: chloraprep       Needles:  Injection technique: Single-shot  Needle Type: Echogenic Stimulator Needle     Needle Length: 9cm  Needle Gauge: 21     Additional Needles:   Procedures:,,,, ultrasound used (permanent image in chart),,    Narrative:  Start time: 07/11/2023 7:25 AM End time: 07/11/2023 7:28 AM Injection made incrementally with aspirations every 5 mL.  Performed by: Personally  Anesthesiologist: Willian Harrow, MD  Additional Notes: Discussed risks and benefits of the nerve block in detail, including but not limited vascular injury, permanent nerve damage and infection.   Patient tolerated the procedure well. Local anesthetic introduced in an incremental fashion under minimal resistance after negative aspirations. No paresthesias were elicited. After completion of the procedure, no acute issues were identified and patient continued to be monitored by RN.

## 2023-07-11 NOTE — Anesthesia Procedure Notes (Signed)
 Anesthesia Regional Block: Adductor canal block   Pre-Anesthetic Checklist: , timeout performed,  Correct Patient, Correct Site, Correct Laterality,  Correct Procedure, Correct Position, site marked,  Risks and benefits discussed,  Surgical consent,  Pre-op evaluation,  At surgeon's request and post-op pain management  Laterality: Right  Prep: chloraprep       Needles:  Injection technique: Single-shot  Needle Type: Echogenic Stimulator Needle     Needle Length: 9cm  Needle Gauge: 21     Additional Needles:   Procedures:,,,, ultrasound used (permanent image in chart),,    Narrative:  Start time: 07/11/2023 7:28 AM End time: 07/11/2023 7:32 AM Injection made incrementally with aspirations every 5 mL.  Performed by: Personally  Anesthesiologist: Willian Harrow, MD  Additional Notes: Discussed risks and benefits of the nerve block in detail, including but not limited vascular injury, permanent nerve damage and infection.   Patient tolerated the procedure well. Local anesthetic introduced in an incremental fashion under minimal resistance after negative aspirations. No paresthesias were elicited. After completion of the procedure, no acute issues were identified and patient continued to be monitored by RN.

## 2023-07-11 NOTE — Discharge Instructions (Addendum)
 Katheryne Pane, MD Alona Jamaica PA-C [facility] [address] [phone] (tel)   [phone] (fax)                                  POST-OPERATIVE INSTRUCTIONS     WEIGHT BEARING STATUS: Non-weightbearing right lower extremity  RANGE OF MOTION/ACTIVITY:  Maintain splint. Ok for hip and knee motion  WOUND CARE Please keep splint clean dry and intact until follow-up. If your splint gets wet for any reason please contact the office immediately.  Do not stick anything down your splint such as pencils, momey, hangers to try and scratch yourself.  If you feel itchy take Benadryl  as prescribed on the bottle for itching You may shower on Post-Op Day #2.  You must keep splint dry during this process and may find that a plastic bag taped around the extremity or alternatively a towel based bath may be a better option.   If you get your splint wet or if it is damaged please contact our clinic.  EXERCISES Due to your splint being in place you will not be able to bear weight through your extremity.   DO NOT PUT ANY WEIGHT ON YOUR OPERATIVE LEG Please use crutches or a walker to avoid weight bearing.   DVT/PE prophylaxis: Aspirin  81 mg daily x 30 days  DIET: As you were eating previously.  Can use over the counter stool softeners and bowel preparations, such as Miralax , to help with bowel movements.  Narcotics can be constipating.  Be sure to drink plenty of fluids  REGIONAL ANESTHESIA (NERVE BLOCKS) The anesthesia team may have performed a nerve block for you if safe in the setting of your care.  This is a great tool used to minimize pain.  Typically the block may start wearing off overnight but the long acting medicine may last for 3-4 days.  The nerve block wearing off can be a challenging period but please utilize your as needed pain medications to try and manage this period.    POST-OP MEDICATIONS- Multimodal approach to pain control  In general your pain will  be controlled with a combination of substances.  Prescriptions unless otherwise discussed are electronically sent to your pharmacy.  This is a carefully made plan we use to minimize narcotic use.     - Ibuprofen  - Anti-inflammatory medication taken on a scheduled basis  - Acetaminophen  - Non-narcotic pain medicine taken on a scheduled basis   - Oxycodone  - This is a strong narcotic, to be used only on an "as needed" basis for pain.  -  Aspirin  81mg  - This medicine is used to minimize the risk of blood clots after surgery.             -          Zofran  - take as needed for nausea    FOLLOW-UP If you develop a Fever (>101.5), Redness or Drainage from the surgical incision site, please call our office to arrange for an evaluation. Please call the office to schedule a follow-up appointment for your incision check if you do not already have one, 7-10 days post-operatively.   VISIT OUR WEBSITE FOR ADDITIONAL INFORMATION: [email or web address]   HELPFUL INFORMATION  If you had a block, it will wear off between 8-24 hrs postop typically.  This is period when your pain may go from nearly zero to the pain you would have had  postop without the block.  This is an abrupt transition but nothing dangerous is happening.  You may take an extra dose of narcotic when this happens.  You should wean off your narcotic medicines as soon as you are able.  Most patients will be off or using minimal narcotics before their first postop appointment.   We suggest you use the pain medication the first night prior to going to bed, in order to ease any pain when the anesthesia wears off. You should avoid taking pain medications on an empty stomach as it will make you nauseous.  Do not drink alcoholic beverages or take illicit drugs when taking pain medications.  In most states it is against the law to drive while you are in a splint or sling.  And certainly against the law to drive while taking narcotics.  You may return to  work/school in the next couple of days when you feel up to it.   Pain medication may make you constipated.  Below are a few solutions to try in this order: Decrease the amount of pain medication if you aren't having pain. Drink lots of decaffeinated fluids. Drink prune juice and/or each dried prunes  If the first 3 don't work start with additional solutions Take Colace - an over-the-counter stool softener Take Senokot - an over-the-counter laxative Take Miralax  - a stronger over-the-counter laxative

## 2023-07-11 NOTE — Interval H&P Note (Signed)
 History and Physical Interval Note:  07/11/2023 7:24 AM  Shelley [name]  has presented today for surgery, with the diagnosis of Right ankle arthritis.  The various methods of treatment have been discussed with the patient and family. After consideration of risks, benefits and other options for treatment, the patient has consented to  Procedure(s): ARTHRODESIS ANKLE (Right) as a surgical intervention.  The patient's history has been reviewed, patient examined, no change in status, stable for surgery.  I have reviewed the patient's chart and labs.  Questions were answered to the patient's satisfaction.     Becky Berberian P Lavelle Akel

## 2023-07-11 NOTE — Transfer of Care (Signed)
 Immediate Anesthesia Transfer of Care Note  Patient: Shelley [name]  Procedure(s) Performed: ARTHRODESIS ANKLE (Right: Ankle)  Patient Location: PACU  Anesthesia Type:GA combined with regional for post-op pain  Level of Consciousness: awake and drowsy  Airway & Oxygen Therapy: Patient Spontanous Breathing  Post-op Assessment: Report given to RN and Post -op Vital signs reviewed and stable  Post vital signs: Reviewed and stable  Last Vitals:  Vitals Value Taken Time  BP 102/48 07/11/23 1000  Temp    Pulse 127 07/11/23 1004  Resp 23 07/11/23 1004  SpO2 95 % 07/11/23 1004  Vitals shown include unfiled device data.  Last Pain:  Vitals:   07/11/23 0615  PainSc: 4       Patients Stated Pain Goal: 0 (07/11/23 0615)  Complications: No notable events documented.

## 2023-07-11 NOTE — Anesthesia Postprocedure Evaluation (Signed)
 Anesthesia Post Note  Patient: Shelley [name]  Procedure(s) Performed: ARTHRODESIS ANKLE (Right: Ankle)     Patient location during evaluation: PACU Anesthesia Type: General Level of consciousness: awake and alert Pain management: pain level controlled Vital Signs Assessment: post-procedure vital signs reviewed and stable Respiratory status: spontaneous breathing, nonlabored ventilation, respiratory function stable and patient connected to nasal cannula oxygen Cardiovascular status: blood pressure returned to baseline and stable Postop Assessment: no apparent nausea or vomiting Anesthetic complications: no  No notable events documented.  Last Vitals:  Vitals:   07/11/23 1057 07/11/23 1100  BP: (!) 143/98   Pulse: 98 93  Resp: 17 16  Temp: 36.9 C   SpO2: 98% 98%    Last Pain:  Vitals:   07/11/23 1057  PainSc: 3                  Willian Harrow

## 2023-07-16 ENCOUNTER — Encounter (HOSPITAL_COMMUNITY): Payer: Self-pay | Admitting: Student

## 2023-07-16 LAB — AEROBIC/ANAEROBIC CULTURE W GRAM STAIN (SURGICAL/DEEP WOUND)
Culture: NO GROWTH
Gram Stain: NONE SEEN

## 2023-11-24 ENCOUNTER — Encounter: Payer: Self-pay | Admitting: Student
